# Patient Record
Sex: Male | Born: 1945 | Race: White | Hispanic: No | Smoking: Former smoker
Health system: Southern US, Community
[De-identification: ages and names within clinical notes are randomized; demographics above are authoritative.]

## PROBLEM LIST (undated history)

## (undated) DIAGNOSIS — E782 Mixed hyperlipidemia: Secondary | ICD-10-CM

## (undated) DIAGNOSIS — N529 Male erectile dysfunction, unspecified: Secondary | ICD-10-CM

## (undated) DIAGNOSIS — T7840XA Allergy, unspecified, initial encounter: Secondary | ICD-10-CM

## (undated) DIAGNOSIS — J309 Allergic rhinitis, unspecified: Secondary | ICD-10-CM

## (undated) DIAGNOSIS — N401 Enlarged prostate with lower urinary tract symptoms: Secondary | ICD-10-CM

## (undated) DIAGNOSIS — K4090 Unilateral inguinal hernia, without obstruction or gangrene, not specified as recurrent: Secondary | ICD-10-CM

## (undated) DIAGNOSIS — Z973 Presence of spectacles and contact lenses: Secondary | ICD-10-CM

## (undated) DIAGNOSIS — K649 Unspecified hemorrhoids: Secondary | ICD-10-CM

## (undated) DIAGNOSIS — G454 Transient global amnesia: Secondary | ICD-10-CM

## (undated) DIAGNOSIS — E785 Hyperlipidemia, unspecified: Secondary | ICD-10-CM

## (undated) HISTORY — DX: Unspecified hemorrhoids: K64.9

## (undated) HISTORY — DX: Allergy, unspecified, initial encounter: T78.40XA

## (undated) HISTORY — PX: APPENDECTOMY: SHX54

## (undated) HISTORY — PX: OTHER SURGICAL HISTORY: SHX169

## (undated) HISTORY — DX: Hyperlipidemia, unspecified: E78.5

## (undated) HISTORY — PX: TONSILLECTOMY: SUR1361

## (undated) HISTORY — DX: Transient global amnesia: G45.4

---
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
## 1978-12-15 HISTORY — PX: EXCISIONAL HEMORRHOIDECTOMY: SHX1541

## 2013-09-14 HISTORY — PX: COLONOSCOPY: SHX5424

## 2013-09-14 LAB — HM COLONOSCOPY

## 2014-12-15 DIAGNOSIS — C61 Malignant neoplasm of prostate: Secondary | ICD-10-CM

## 2014-12-15 HISTORY — DX: Malignant neoplasm of prostate: C61

## 2015-12-24 HISTORY — PX: COLONOSCOPY: SHX174

## 2015-12-24 LAB — HM COLONOSCOPY

## 2017-04-06 LAB — HM COLONOSCOPY

## 2018-12-15 HISTORY — PX: CATARACT EXTRACTION W/ INTRAOCULAR LENS IMPLANT: SHX1309

## 2019-11-21 ENCOUNTER — Other Ambulatory Visit: Payer: Self-pay

## 2019-11-22 ENCOUNTER — Ambulatory Visit (INDEPENDENT_AMBULATORY_CARE_PROVIDER_SITE_OTHER): Payer: Medicare Other | Admitting: Family Medicine

## 2019-11-22 ENCOUNTER — Encounter: Payer: Self-pay | Admitting: Family Medicine

## 2019-11-22 VITALS — BP 128/74 | HR 72 | Temp 97.3°F | Ht 73.0 in | Wt 195.0 lb

## 2019-11-22 DIAGNOSIS — E782 Mixed hyperlipidemia: Secondary | ICD-10-CM

## 2019-11-22 DIAGNOSIS — J309 Allergic rhinitis, unspecified: Secondary | ICD-10-CM

## 2019-11-22 DIAGNOSIS — Z8546 Personal history of malignant neoplasm of prostate: Secondary | ICD-10-CM | POA: Insufficient documentation

## 2019-11-22 DIAGNOSIS — C61 Malignant neoplasm of prostate: Secondary | ICD-10-CM | POA: Diagnosis not present

## 2019-11-22 DIAGNOSIS — K635 Polyp of colon: Secondary | ICD-10-CM

## 2019-11-22 DIAGNOSIS — Z8249 Family history of ischemic heart disease and other diseases of the circulatory system: Secondary | ICD-10-CM | POA: Diagnosis not present

## 2019-11-22 LAB — COMPREHENSIVE METABOLIC PANEL
ALT: 18 U/L (ref 0–53)
AST: 17 U/L (ref 0–37)
Albumin: 4 g/dL (ref 3.5–5.2)
Alkaline Phosphatase: 53 U/L (ref 39–117)
BUN: 18 mg/dL (ref 6–23)
CO2: 31 mEq/L (ref 19–32)
Calcium: 9.5 mg/dL (ref 8.4–10.5)
Chloride: 106 mEq/L (ref 96–112)
Creatinine, Ser: 1.03 mg/dL (ref 0.40–1.50)
GFR: 70.36 mL/min (ref >60.00)
Glucose, Bld: 82 mg/dL (ref 70–99)
Potassium: 5.2 mEq/L — ABNORMAL HIGH (ref 3.5–5.1)
Sodium: 140 mEq/L (ref 135–145)
Total Bilirubin: 0.5 mg/dL (ref 0.2–1.2)
Total Protein: 6.2 g/dL (ref 6.0–8.3)

## 2019-11-22 LAB — CBC WITH DIFFERENTIAL/PLATELET
Basophils Absolute: 0 10*3/uL (ref 0.0–0.1)
Basophils Relative: 0.7 % (ref 0.0–3.0)
Eosinophils Absolute: 0.1 10*3/uL (ref 0.0–0.7)
Eosinophils Relative: 1.8 % (ref 0.0–5.0)
HCT: 49.3 % (ref 39.0–52.0)
Hemoglobin: 16.3 g/dL (ref 13.0–17.0)
Lymphocytes Relative: 31.2 % (ref 12.0–46.0)
Lymphs Abs: 1.3 10*3/uL (ref 0.7–4.0)
MCHC: 33.1 g/dL (ref 30.0–36.0)
MCV: 98.9 fl (ref 78.0–100.0)
Monocytes Absolute: 0.5 10*3/uL (ref 0.1–1.0)
Monocytes Relative: 11.7 % (ref 3.0–12.0)
Neutro Abs: 2.3 10*3/uL (ref 1.4–7.7)
Neutrophils Relative %: 54.6 % (ref 43.0–77.0)
Platelets: 237 10*3/uL (ref 150.0–400.0)
RBC: 4.98 Mil/uL (ref 4.22–5.81)
RDW: 14 % (ref 11.5–15.5)
WBC: 4.3 10*3/uL (ref 4.0–10.5)

## 2019-11-22 LAB — LIPID PANEL
Cholesterol: 150 mg/dL (ref 0–200)
HDL: 51.8 mg/dL (ref >39.00)
LDL Cholesterol: 85 mg/dL (ref 0–99)
NonHDL: 98.47
Total CHOL/HDL Ratio: 3
Triglycerides: 67 mg/dL (ref 0.0–149.0)
VLDL: 13.4 mg/dL (ref 0.0–40.0)

## 2019-11-22 LAB — PSA: PSA: 5.93 ng/mL — ABNORMAL HIGH (ref 0.10–4.00)

## 2019-11-22 NOTE — Progress Notes (Signed)
Subjective  CC:  Chief Complaint  Patient presents with  . Establish Care  . Hand Pain    Bilateral  . Annual Exam    HPI: Paul Foster is a 72 y.o. male who presents to [facility] [place] today to establish care with me as a new patient.  No records are currently available for my review, data is pt reported.  He has the following concerns or needs:  Prostate cancer: dxd about 4 years ago. No treatment, waiting and watching. Needs urology referral for f/u. Need old records.   HLD on statin and doing well. Has very strong FH of early cad: dad, brothers and multiple uncles.   H/o colon polyps: last colonoscopy he things was about 5 years ago.   Allergies.   Hm: had pneumonia shot and shingrix at [facility] last year: need records. Had flu shot this year. Fasting for lab work today. Eye exam up to date. Pt declines AWV.   Assessment  1. Mixed hyperlipidemia   2. Family history of premature CAD   3. Polyp of colon, unspecified part of colon, unspecified type   4. Chronic allergic rhinitis   5. Prostate cancer ([place])      Plan   HLD on statin: check lipids and lfts  rec daily baby asa   Need to get GI records to see if needs another colonoscopy. ROR signed  Prostate ca: check psa and refer to urology. Request records.   Clarify imm history and then update accordingly   Follow up:  Return in about 1 year (around 11/21/2020) for complete physical. Orders Placed This Encounter  Procedures  . CBC w/Diff  . CMP  . Lipids  . PSA  . Ambulatory referral to Urology   No orders of the defined types were placed in this encounter.    Depression screen PHQ 2/9 11/22/2019  Decreased Interest 0  Down, Depressed, Hopeless 0  PHQ - 2 Score 0    We updated and reviewed the patient's past history in detail and it is documented below.  Patient Active Problem List   Diagnosis Date Noted  . Mixed hyperlipidemia 11/22/2019  . Family history of premature CAD  11/22/2019  . Polyp of colon 11/22/2019  . Chronic allergic rhinitis 11/22/2019  . Prostate cancer ([place]) 11/22/2019   Health Maintenance  Topic Date Due  . COLONOSCOPY  09/08/1994  . PNA vac Low Risk Adult (2 of 2 - PPSV23) 12/29/2018  . INFLUENZA VACCINE  Completed  . TETANUS/TDAP  Discontinued  . Hepatitis C Screening  Discontinued   Immunization History  Administered Date(s) Administered  . Influenza-Unspecified 08/30/2019   Current Meds  Medication Sig  . Ascorbic Acid (VITAMIN C) 1000 MG tablet Take 1,000 mg by mouth daily.  . ASPIRIN 81 PO Take by mouth.  . Calcium Carbonate (CALCIUM 600 PO) Take by mouth.  . Cholecalciferol (VITAMIN D3 MAXIMUM STRENGTH) 125 MCG (5000 UT) capsule Take 5,000 Units by mouth daily.  Marland Kitchen docusate sodium (COLACE) 100 MG capsule Take 200 mg by mouth daily.  . fluticasone (FLONASE) 50 MCG/ACT nasal spray Place into both nostrils daily.  Marland Kitchen loratadine (CLARITIN) 10 MG tablet Take 10 mg by mouth daily.  Marland Kitchen lovastatin (MEVACOR) 10 MG tablet Take 5 mg by mouth at bedtime.  . Multiple Vitamin (MULTIVITAMIN) tablet Take 1 tablet by mouth daily.  . Omega 3-6-9 Fatty Acids (OMEGA-3 & OMEGA-6 FISH OIL PO) Take by mouth.  . Wheat Dextrin (BENEFIBER PO)  Take by mouth.    Allergies: Patient has No Known Allergies. Past Medical History Patient  has no past medical history on file. Past Surgical History Patient  has a past surgical history that includes Tonsillectomy; Appendectomy; and hemorrhoidectomy. Family History: Patient family history is not on file. Social History:  Patient  reports that he has quit smoking. He has never used smokeless tobacco. He reports previous alcohol use. He reports that he does not use drugs.  Review of Systems: Constitutional: negative for fever or malaise Ophthalmic: negative for photophobia, double vision or loss of vision Cardiovascular: negative for chest pain, dyspnea on exertion, or new LE swelling Respiratory:  negative for SOB or persistent cough Gastrointestinal: negative for abdominal pain, change in bowel habits or melena Genitourinary: negative for dysuria or gross hematuria Musculoskeletal: negative for new gait disturbance or muscular weakness, + burning pain in palms after working in yard sometimes, right shoulder clicks w/o pain.  Integumentary: negative for new or persistent rashes Neurological: negative for TIA or stroke symptoms Psychiatric: negative for SI or delusions Allergic/Immunologic: negative for hives  Patient Care Team    Relationship Specialty Notifications Start End  Leamon Arnt, MD PCP - General Family Medicine  11/22/19     Objective  Vitals: BP 128/74 (BP Location: Right Arm, Patient Position: Sitting, Cuff Size: Normal)   Pulse 72   Temp (!) 97.3 F (36.3 C) (Temporal)   Ht 6\' 1"  (1.854 m)   Wt 195 lb (88.5 kg)   SpO2 96%   BMI 25.73 kg/m  General:  Well developed, well nourished, no acute distress , looks great Psych:  Alert and oriented,normal mood and affect HEENT:  Normocephalic, atraumatic, non-icteric sclera, PERRL, oropharynx is without mass or exudate, supple neck without adenopathy, mass or thyromegaly Cardiovascular:  RRR without gallop, rub or murmur, nondisplaced PMI Respiratory:  Good breath sounds bilaterally, CTAB with normal respiratory effort Gastrointestinal: normal bowel sounds, soft, non-tender, no noted masses. No HSM MSK: no deformities, contusions. Joints are without erythema or swelling Skin:  Warm, no rashes or suspicious lesions noted Neurologic:    Mental status is normal. Gross motor and sensory exams are normal. Normal gait   Commons side effects, risks, benefits, and alternatives for medications and treatment plan prescribed today were discussed, and the patient expressed understanding of the given instructions. Patient is instructed to call or message via MyChart if he/she has any questions or concerns regarding our treatment  plan. No barriers to understanding were identified. We discussed Red Flag symptoms and signs in detail. Patient expressed understanding regarding what to do in case of urgent or emergency type symptoms.   Medication list was reconciled, printed and provided to the patient in AVS. Patient instructions and summary information was reviewed with the patient as documented in the AVS. This note was prepared with assistance of Dragon voice recognition software. Occasional wrong-word or sound-a-like substitutions may have occurred due to the inherent limitations of voice recognition software  This visit occurred during the SARS-CoV-2 public health emergency.  Safety protocols were in place, including screening questions prior to the visit, additional usage of staff PPE, and extensive cleaning of exam room while observing appropriate contact time as indicated for disinfecting solutions.

## 2019-11-22 NOTE — Patient Instructions (Addendum)
Please return in 12 months for your annual complete physical; please come fasting. Please sign a release of records for both your prior PCP and GI doctors offices for my review.   Please check with [facility] to get a list of the vaccinations you had last year for me. I will then update what remains due.    It was a pleasure meeting you today! Thank you for choosing [place] to meet your healthcare needs! I truly look forward to working with you. If you have any questions or concerns, please send me a message via Mychart or call the office at [phone].   Preventive Care 44 Years and Older, Male Preventive care refers to lifestyle choices and visits with your health care provider that can promote health and wellness. This includes:  A yearly physical exam. This is also called an annual well check.  Regular dental and eye exams.  Immunizations.  Screening for certain conditions.  Healthy lifestyle choices, such as diet and exercise. What can I expect for my preventive care visit? Physical exam Your health care provider will check:  Height and weight. These may be used to calculate body mass index (BMI), which is a measurement that tells if you are at a healthy weight.  Heart rate and blood pressure.  Your skin for abnormal spots. Counseling Your health care provider may ask you questions about:  Alcohol, tobacco, and drug use.  Emotional well-being.  Home and relationship well-being.  Sexual activity.  Eating habits.  History of falls.  Memory and ability to understand (cognition).  Work and work Statistician. What immunizations do I need?  Influenza (flu) vaccine  This is recommended every year. Tetanus, diphtheria, and pertussis (Tdap) vaccine  You may need a Td booster every 10 years. Varicella (chickenpox) vaccine  You may need this vaccine if you have not already been vaccinated. Zoster (shingles) vaccine  You may need this after age 47. Pneumococcal  conjugate (PCV13) vaccine  One dose is recommended after age 10. Pneumococcal polysaccharide (PPSV23) vaccine  One dose is recommended after age 43. Measles, mumps, and rubella (MMR) vaccine  You may need at least one dose of MMR if you were born in 1957 or later. You may also need a second dose. Meningococcal conjugate (MenACWY) vaccine  You may need this if you have certain conditions. Hepatitis A vaccine  You may need this if you have certain conditions or if you travel or work in places where you may be exposed to hepatitis A. Hepatitis B vaccine  You may need this if you have certain conditions or if you travel or work in places where you may be exposed to hepatitis B. Haemophilus influenzae type b (Hib) vaccine  You may need this if you have certain conditions. You may receive vaccines as individual doses or as more than one vaccine together in one shot (combination vaccines). Talk with your health care provider about the risks and benefits of combination vaccines. What tests do I need? Blood tests  Lipid and cholesterol levels. These may be checked every 5 years, or more frequently depending on your overall health.  Hepatitis C test.  Hepatitis B test. Screening  Lung cancer screening. You may have this screening every year starting at age 55 if you have a 30-pack-year history of smoking and currently smoke or have quit within the past 15 years.  Colorectal cancer screening. All adults should have this screening starting at age 22 and continuing until age 12. Your health care  provider may recommend screening at age 31 if you are at increased risk. You will have tests every 1-10 years, depending on your results and the type of screening test.  Prostate cancer screening. Recommendations will vary depending on your family history and other risks.  Diabetes screening. This is done by checking your blood sugar (glucose) after you have not eaten for a while (fasting). You may  have this done every 1-3 years.  Abdominal aortic aneurysm (AAA) screening. You may need this if you are a current or former smoker.  Sexually transmitted disease (STD) testing. Follow these instructions at home: Eating and drinking  Eat a diet that includes fresh fruits and vegetables, whole grains, lean protein, and low-fat dairy products. Limit your intake of foods with high amounts of sugar, saturated fats, and salt.  Take vitamin and mineral supplements as recommended by your health care provider.  Do not drink alcohol if your health care provider tells you not to drink.  If you drink alcohol: ? Limit how much you have to 0-2 drinks a day. ? Be aware of how much alcohol is in your drink. In the [place] one drink equals one 12 oz bottle of beer (355 mL), one 5 oz glass of wine (148 mL), or one 1 oz glass of hard liquor (44 mL). Lifestyle  Take daily care of your teeth and gums.  Stay active. Exercise for at least 30 minutes on 5 or more days each week.  Do not use any products that contain nicotine or tobacco, such as cigarettes, e-cigarettes, and chewing tobacco. If you need help quitting, ask your health care provider.  If you are sexually active, practice safe sex. Use a condom or other form of protection to prevent STIs (sexually transmitted infections).  Talk with your health care provider about taking a low-dose aspirin or statin. What's next?  Visit your health care provider once a year for a well check visit.  Ask your health care provider how often you should have your eyes and teeth checked.  Stay up to date on all vaccines. This information is not intended to replace advice given to you by your health care provider. Make sure you discuss any questions you have with your health care provider. Document Released: 12/28/2015 Document Revised: 11/25/2018 Document Reviewed: 11/25/2018 Elsevier Patient Education  2020 [employer].

## 2019-11-23 NOTE — Progress Notes (Signed)
Please call patient: I have reviewed his/her lab results. Labs all look good. Potassium level is just above normal. Ensure he is NOT on any potassium supplements. Nothing else needed at this time.  PSA is 5.93; not sure how this compares to his most recent levels. Have requested records and urology referral. Thanks.

## 2019-11-25 ENCOUNTER — Telehealth: Payer: Self-pay

## 2019-11-25 NOTE — Telephone Encounter (Signed)
Noted.  Abstracted into chart.

## 2019-11-25 NOTE — Telephone Encounter (Signed)
Copied from [place] [id number]. Topic: General - Other >> Nov 25, 2019 12:06 PM Lennox Solders wrote: Reason for CRM: pt is calling to let dr andy know he had colonoscopy on 04/06/2017. Pt does not know who did colonoscopy. [facility] in [place] in [place] will fax a list of pt vaccine

## 2020-01-11 ENCOUNTER — Ambulatory Visit: Payer: Medicare Other

## 2020-01-20 ENCOUNTER — Ambulatory Visit: Payer: Medicare Other | Attending: Internal Medicine

## 2020-01-20 DIAGNOSIS — Z23 Encounter for immunization: Secondary | ICD-10-CM | POA: Insufficient documentation

## 2020-01-20 NOTE — Progress Notes (Signed)
   Covid-19 Vaccination Clinic  Name:  Christropher Untiedt    MRN: [id number] DOB: 08-03-1946  01/20/2020  Mr. Demery was observed post Covid-19 immunization for 15 minutes without incidence. He was provided with Vaccine Information Sheet and instruction to access the V-Safe system.   Mr. Wittman was instructed to call 911 with any severe reactions post vaccine: Marland Kitchen Difficulty breathing  . Swelling of your face and throat  . A fast heartbeat  . A bad rash all over your body  . Dizziness and weakness    Immunizations Administered    Name Date Dose VIS Date Route   Pfizer COVID-19 Vaccine 01/20/2020 12:06 PM 0.3 mL 11/25/2019 Intramuscular   Manufacturer: [place]   Lot: [id number]   [place]: [id number]

## 2020-02-14 ENCOUNTER — Ambulatory Visit: Payer: Medicare Other | Attending: Internal Medicine

## 2020-02-14 DIAGNOSIS — Z23 Encounter for immunization: Secondary | ICD-10-CM | POA: Insufficient documentation

## 2020-02-14 NOTE — Progress Notes (Signed)
   Covid-19 Vaccination Clinic  Name:  Breno Remo    MRN: [id number] DOB: Aug 07, 1946  02/14/2020  Mr. Fink was observed post Covid-19 immunization for 15 minutes without incident. He was provided with Vaccine Information Sheet and instruction to access the V-Safe system.   Mr. Klocke was instructed to call 911 with any severe reactions post vaccine: Marland Kitchen Difficulty breathing  . Swelling of face and throat  . A fast heartbeat  . A bad rash all over body  . Dizziness and weakness   Immunizations Administered    Name Date Dose VIS Date Route   Pfizer COVID-19 Vaccine 02/14/2020 11:40 AM 0.3 mL 11/25/2019 Intramuscular   Manufacturer: [place]   Lot: [id number]   [place]: [id number]

## 2020-05-28 ENCOUNTER — Other Ambulatory Visit: Payer: Self-pay

## 2020-05-28 ENCOUNTER — Telehealth: Payer: Self-pay | Admitting: Family Medicine

## 2020-05-28 MED ORDER — LOVASTATIN 10 MG PO TABS: 5.0000 mg | ORAL_TABLET | Freq: Every day | ORAL | 3 refills | Status: DC

## 2020-05-28 NOTE — Telephone Encounter (Signed)
Script sent to [facility]  

## 2020-05-28 NOTE — Telephone Encounter (Signed)
MEDICATION:  lovastatin (MEVACOR) 20 MG tablet  PHARMACY:  [place], [place] Phone:  [phone]  Fax:  [phone]       Comments: Patient is completely out   **Let patient know to contact pharmacy at the end of the day to make sure medication is ready. **  ** Please notify patient to allow 48-72 hours to process**  **Encourage patient to contact the pharmacy for refills or they can request refills through [facility]**

## 2020-07-25 ENCOUNTER — Other Ambulatory Visit: Payer: Self-pay

## 2020-07-25 ENCOUNTER — Encounter: Payer: Self-pay | Admitting: Family Medicine

## 2020-07-25 ENCOUNTER — Ambulatory Visit (INDEPENDENT_AMBULATORY_CARE_PROVIDER_SITE_OTHER): Payer: Medicare Other | Admitting: Family Medicine

## 2020-07-25 VITALS — BP 120/80 | HR 94 | Temp 98.6°F | Wt 201.8 lb

## 2020-07-25 DIAGNOSIS — F4321 Adjustment disorder with depressed mood: Secondary | ICD-10-CM

## 2020-07-25 DIAGNOSIS — C61 Malignant neoplasm of prostate: Secondary | ICD-10-CM | POA: Diagnosis not present

## 2020-07-25 DIAGNOSIS — E782 Mixed hyperlipidemia: Secondary | ICD-10-CM | POA: Diagnosis not present

## 2020-07-25 DIAGNOSIS — Z8249 Family history of ischemic heart disease and other diseases of the circulatory system: Secondary | ICD-10-CM

## 2020-07-25 MED ORDER — LOVASTATIN 10 MG PO TABS: 10.0000 mg | ORAL_TABLET | Freq: Every day | ORAL | 3 refills | Status: DC

## 2020-07-25 NOTE — Progress Notes (Signed)
Subjective  CC:  Chief Complaint  Patient presents with  . mixed hyperlipidemia    has been taking Lovastatin but read the directions wrong. Taking 10 mg instead of 5 mg    HPI: Paul Foster is a 74 y.o. male who presents to the office today to address the problems listed above in the chief complaint.  Paul Foster is here overall doing well.  His wife passed July 19 and he is coping appropriately.  He was with her at the time of her death and said she passed peacefully.  Now he is trying to coordinate his medical care.  Doing so he learned that the dose on his Mevacor bottle was unclear.  By chart review it looks like he should be on 10 mg daily.  He is currently taking 10 mg daily although prior to that he may have been taking 5 mg or 10 mg.  The chart is unclear.  He has had 2 different doses prescribed to him, 20 mg pill and a 10 mg pill.  He feels fine.  No myalgias.  Prostate cancer: He was referred to [facility] urology and reports that the visit there went well.  Everything is stable.  I need to get those records.  Still awaiting records from his prior PCP/gastroenterologist. Assessment  1. Mixed hyperlipidemia   2. Family history of premature CAD   3. Prostate cancer ([place])   4. Grief      Plan   Hyperlipidemia: Suspect he should be on 10 mg of Mevacor.  Recheck nonfasting lipids today and adjust as needed.  Check LFTs.  Reassured  Prostate cancer: We will [facility] records.  Follow up: Has physical scheduled for: 11/27/2020  Orders Placed This Encounter  Procedures  . Lipid panel  . Hepatic function panel   Meds ordered this encounter  Medications  . lovastatin (MEVACOR) 10 MG tablet    Sig: Take 1 tablet (10 mg total) by mouth at bedtime.    Dispense:  90 tablet    Refill:  3      I reviewed the patients updated PMH, FH, and SocHx.    Patient Active Problem List   Diagnosis Date Noted  . Mixed hyperlipidemia 11/22/2019  . Family history of  premature CAD 11/22/2019  . Polyp of colon 11/22/2019  . Chronic allergic rhinitis 11/22/2019  . Prostate cancer ([place]) 11/22/2019   Current Meds  Medication Sig  . Ascorbic Acid (VITAMIN C) 1000 MG tablet Take 1,000 mg by mouth daily.  . ASPIRIN 81 PO Take by mouth.  . Calcium Carbonate (CALCIUM 600 PO) Take by mouth.  . Cholecalciferol (VITAMIN D3 MAXIMUM STRENGTH) 125 MCG (5000 UT) capsule Take 5,000 Units by mouth daily.  Marland Kitchen docusate sodium (COLACE) 100 MG capsule Take 200 mg by mouth daily.  . fluticasone (FLONASE) 50 MCG/ACT nasal spray Place into both nostrils daily.  Marland Kitchen loratadine (CLARITIN) 10 MG tablet Take 10 mg by mouth daily.  Marland Kitchen lovastatin (MEVACOR) 10 MG tablet Take 1 tablet (10 mg total) by mouth at bedtime.  . Multiple Vitamin (MULTIVITAMIN) tablet Take 1 tablet by mouth daily.  . Omega 3-6-9 Fatty Acids (OMEGA-3 & OMEGA-6 FISH OIL PO) Take by mouth.  . Wheat Dextrin (BENEFIBER PO) Take by mouth.  . [DISCONTINUED] lovastatin (MEVACOR) 10 MG tablet Take 0.5 tablets (5 mg total) by mouth at bedtime.    Allergies: Patient has No Known Allergies. Family History: Patient family history is not on file. Social History:  Patient  reports that he has quit smoking. He has never used smokeless tobacco. He reports previous alcohol use. He reports that he does not use drugs.  Review of Systems: Constitutional: Negative for fever malaise or anorexia Cardiovascular: negative for chest pain Respiratory: negative for SOB or persistent cough Gastrointestinal: negative for abdominal pain  Objective  Vitals: BP 120/80   Pulse 94   Temp 98.6 F (37 C) (Temporal)   Wt 201 lb 12.8 oz (91.5 kg)   SpO2 97%   BMI 26.62 kg/m  General: no acute distress , A&Ox3 Cardiovascular:  RRR without murmur or gallop.  Respiratory:  Good breath sounds bilaterally, CTAB with normal respiratory effort      Commons side effects, risks, benefits, and alternatives for medications and treatment  plan prescribed today were discussed, and the patient expressed understanding of the given instructions. Patient is instructed to call or message via MyChart if he/she has any questions or concerns regarding our treatment plan. No barriers to understanding were identified. We discussed Red Flag symptoms and signs in detail. Patient expressed understanding regarding what to do in case of urgent or emergency type symptoms.   Medication list was reconciled, printed and provided to the patient in AVS. Patient instructions and summary information was reviewed with the patient as documented in the AVS. This note was prepared with assistance of Dragon voice recognition software. Occasional wrong-word or sound-a-like substitutions may have occurred due to the inherent limitations of voice recognition software  This visit occurred during the SARS-CoV-2 public health emergency.  Safety protocols were in place, including screening questions prior to the visit, additional usage of staff PPE, and extensive cleaning of exam room while observing appropriate contact time as indicated for disinfecting solutions.

## 2020-07-25 NOTE — Patient Instructions (Signed)
Please follow up as scheduled for your next visit with me: 11/27/2020   It looks like you used have the 20mg  dose pill of mevacor; this is how it got to take 1/2, however the 10mg  dose was reordered.  I'll let you know if 10mg  nightly is what is needed. Thanks! And again, may you grieve and recover well. I'm glad I got to meet your wife.  May she rest in peace.  If you have any questions or concerns, please don't hesitate to send me a message via MyChart or call the office at [phone]. Thank you for visiting with [place] today! It's our pleasure caring for you.

## 2020-07-26 LAB — HEPATIC FUNCTION PANEL
AG Ratio: 1.7 (calc) (ref 1.0–2.5)
ALT: 17 U/L (ref 9–46)
AST: 15 U/L (ref 10–35)
Albumin: 4 g/dL (ref 3.6–5.1)
Alkaline phosphatase (APISO): 56 U/L (ref 35–144)
Bilirubin, Direct: 0.1 mg/dL (ref 0.0–0.2)
Globulin: 2.3 g/dL (calc) (ref 1.9–3.7)
Indirect Bilirubin: 0.4 mg/dL (calc) (ref 0.2–1.2)
Total Bilirubin: 0.5 mg/dL (ref 0.2–1.2)
Total Protein: 6.3 g/dL (ref 6.1–8.1)

## 2020-07-26 LAB — LIPID PANEL
Cholesterol: 161 mg/dL (ref <200)
HDL: 50 mg/dL (ref >=40)
LDL Cholesterol (Calc): 88 mg/dL (calc)
Non-HDL Cholesterol (Calc): 111 mg/dL (calc) (ref <130)
Total CHOL/HDL Ratio: 3.2 (calc) (ref <5.0)
Triglycerides: 136 mg/dL (ref <150)

## 2020-07-26 NOTE — Progress Notes (Signed)
Please call patient: I have reviewed his/her lab results. Labs look fine. Cholesterol is good. Please have him continue mevacor 10mg  nightly.

## 2020-07-31 ENCOUNTER — Telehealth: Payer: Self-pay

## 2020-07-31 NOTE — Telephone Encounter (Signed)
Patient was notified that medication can be purchased over the counter.

## 2020-07-31 NOTE — Telephone Encounter (Signed)
Pt is wanting to know where his prescription for claritin was sent. I don't see that any was sent in anywhere. Please Advise

## 2020-08-01 ENCOUNTER — Telehealth: Payer: Self-pay

## 2020-08-01 MED ORDER — LOVASTATIN 10 MG PO TABS: 10.0000 mg | ORAL_TABLET | Freq: Every day | ORAL | 3 refills | Status: DC

## 2020-08-01 NOTE — Telephone Encounter (Signed)
Pharmacy told PT they do not have this prescription in their system. Can you please resend? lovastatin (MEVACOR) 10 MG tablet

## 2020-08-01 NOTE — Telephone Encounter (Signed)
Medication has been resent to the pharmacy.  

## 2020-09-28 ENCOUNTER — Telehealth: Payer: Self-pay

## 2020-09-28 NOTE — Telephone Encounter (Signed)
..   LAST APPOINTMENT DATE: 08/01/2020   NEXT APPOINTMENT DATE:@12 /14/2021  MEDICATION:lovastatin (MEVACOR) 10 MG tablet    PHARMACY:[facility] [place], [place]

## 2020-09-30 ENCOUNTER — Other Ambulatory Visit: Payer: Self-pay

## 2020-09-30 MED ORDER — LOVASTATIN 10 MG PO TABS: 10.0000 mg | ORAL_TABLET | Freq: Every day | ORAL | 3 refills | Status: DC

## 2020-09-30 NOTE — Telephone Encounter (Signed)
[facility]

## 2020-11-27 ENCOUNTER — Ambulatory Visit: Payer: Medicare Other | Admitting: Family Medicine

## 2020-11-27 ENCOUNTER — Ambulatory Visit: Payer: Medicare Other

## 2020-12-18 ENCOUNTER — Encounter: Payer: Self-pay | Admitting: Family Medicine

## 2020-12-18 ENCOUNTER — Other Ambulatory Visit: Payer: Self-pay

## 2020-12-18 ENCOUNTER — Ambulatory Visit (INDEPENDENT_AMBULATORY_CARE_PROVIDER_SITE_OTHER): Payer: Medicare Other | Admitting: Family Medicine

## 2020-12-18 VITALS — BP 158/94 | HR 60 | Temp 98.5°F | Wt 207.8 lb

## 2020-12-18 DIAGNOSIS — C61 Malignant neoplasm of prostate: Secondary | ICD-10-CM | POA: Diagnosis not present

## 2020-12-18 DIAGNOSIS — K635 Polyp of colon: Secondary | ICD-10-CM

## 2020-12-18 DIAGNOSIS — M5432 Sciatica, left side: Secondary | ICD-10-CM

## 2020-12-18 DIAGNOSIS — Z8249 Family history of ischemic heart disease and other diseases of the circulatory system: Secondary | ICD-10-CM

## 2020-12-18 DIAGNOSIS — E782 Mixed hyperlipidemia: Secondary | ICD-10-CM

## 2020-12-18 DIAGNOSIS — R030 Elevated blood-pressure reading, without diagnosis of hypertension: Secondary | ICD-10-CM

## 2020-12-18 DIAGNOSIS — J309 Allergic rhinitis, unspecified: Secondary | ICD-10-CM

## 2020-12-18 NOTE — Progress Notes (Signed)
Subjective  Chief Complaint  Patient presents with  . Annual Exam  . Hyperlipidemia    HPI: Paul Foster is a 75 y.o. male who presents to [facility] [place] today for a Male Wellness Visit. He also has the concerns and/or needs as listed above in the chief complaint. These will be addressed in addition to the Health Maintenance Visit.   Wellness Visit: annual visit with health maintenance review and exam    HM: up to date screens and imms. Lives healthy lifestyle. Doing well overall.  Lifestyle:  Wt Readings from Last 3 Encounters:  07/25/20 201 lb 12.8 oz (91.5 kg)  11/22/19 195 lb (88.5 kg)    Chronic disease management visit and/or acute problem visit:  HLD on statin due for recheck. Has been at goal and is well tolerated. Fasting today  Prostate ca w/ routine f/u with urology. Stable.   C/o left hip/buttock pain radiates down left ant thigh and some stiffness in the mornings. Once he moves around, it resolves. No weakness. No low back pain. No injury.   Patient Active Problem List   Diagnosis Date Noted  . Mixed hyperlipidemia 11/22/2019  . Family history of premature CAD 11/22/2019  . Polyp of colon 11/22/2019  . Chronic allergic rhinitis 11/22/2019  . Prostate cancer ([place]) 11/22/2019   Health Maintenance  Topic Date Due  . COVID-19 Vaccine (4 - Booster for Pfizer series) 03/19/2021  . COLONOSCOPY (Pts 45-62yrs Insurance coverage will need to be confirmed)  04/06/2022  . TETANUS/TDAP  01/06/2029  . INFLUENZA VACCINE  Completed  . PNA vac Low Risk Adult  Completed  . Hepatitis C Screening  Discontinued   Immunization History  Administered Date(s) Administered  . Hepatitis B 03/09/1992  . Influenza-Unspecified 10/07/2018, 08/30/2019, 09/17/2020  . PFIZER SARS-COV-2 Vaccination 01/20/2020, 02/14/2020, 09/18/2020  . Pneumococcal Conjugate-13 10/28/2016  . Pneumococcal Polysaccharide-23 10/07/2018  . Tdap 01/06/2019  . Zoster Recombinat  (Shingrix) 10/07/2018, 01/06/2019   We updated and reviewed the patient's past history in detail and it is documented below. Allergies: Patient has No Known Allergies. Past Medical History  has a past medical history of Allergy and Hyperlipidemia. Past Surgical History Patient  has a past surgical history that includes Tonsillectomy; Appendectomy; and hemorrhoidectomy. Social History Patient  reports that he has quit smoking. He has never used smokeless tobacco. He reports previous alcohol use. He reports that he does not use drugs. Family History family history includes Heart disease in his father. Review of Systems: Constitutional: negative for fever or malaise Ophthalmic: negative for photophobia, double vision or loss of vision Cardiovascular: negative for chest pain, dyspnea on exertion, or new LE swelling Respiratory: negative for SOB or persistent cough Gastrointestinal: negative for abdominal pain, change in bowel habits or melena Genitourinary: negative for dysuria or gross hematuria Musculoskeletal: negative for new gait disturbance or muscular weakness Integumentary: negative for new or persistent rashes Neurological: negative for TIA or stroke symptoms Psychiatric: negative for SI or delusions Allergic/Immunologic: negative for hives  Patient Care Team    Relationship Specialty Notifications Start End  Leamon Arnt, MD PCP - General Family Medicine  11/22/19    Objective  Vitals: There were no vitals taken for this visit. bp elevated 156/94 - no h/o htn General:  Well developed, well nourished, no acute distress  Psych:  Alert and orientedx3,normal mood and affect HEENT:  Normocephalic, atraumatic, non-icteric sclera, PERRL, oropharynx is clear without mass or exudate, supple neck without adenopathy, mass  or thyromegaly Cardiovascular:  Normal S1, S2, RRR without gallop, rub or murmur, nondisplaced PMI, +2 distal pulses in bilateral upper and lower  extremities. Respiratory:  Good breath sounds bilaterally, CTAB with normal respiratory effort Gastrointestinal: normal bowel sounds, soft, non-tender, no noted masses. No HSM MSK: no deformities, contusions. Joints are without erythema or swelling. Spine and CVA region are nontender Skin:  Warm, no rashes or suspicious lesions noted Neurologic:    Mental status is normal. CN 2-11 are normal. Gross motor and sensory exams are normal. Stable gait. No tremor GU: No inguinal hernias or adenopathy are appreciated bilaterally   Assessment  1. Mixed hyperlipidemia   2. Family history of premature CAD   3. Polyp of colon, unspecified part of colon, unspecified type   4. Prostate cancer (HCC)   5. Chronic allergic rhinitis   6. Left sided sciatica   7. Elevated blood pressure reading without diagnosis of hypertension      Plan  Male Wellness Visit:  Age appropriate Health Maintenance and Prevention measures were discussed with patient. Included topics are cancer screening recommendations, ways to keep healthy (see AVS) including dietary and exercise recommendations, regular eye and dental care, use of seat belts, and avoidance of moderate alcohol use and tobacco use.   BMI: discussed patient's BMI and encouraged positive lifestyle modifications to help get to or maintain a target BMI.  HM needs and immunizations were addressed and ordered. See below for orders. See HM and immunization section for updates.  Routine labs and screening tests ordered including cmp, cbc and lipids where appropriate.  Discussed recommendations regarding Vit D and calcium supplementation (see AVS)  Chronic disease f/u and/or acute problem visit: (deemed necessary to be done in addition to the wellness visit):  HLD: recheck today on mevacor. Check lfts  Elevated bp: pt reports due to rushing to get here. He will check readings at home and send them to me.   Mild sciatica +/- OA; alleve as needed. Stretching/ f/u  if worsens  Prostate ca per urology.   alergies are controlled.   Follow up: Return in about 1 year (around 12/18/2021) for complete physical.   Commons side effects, risks, benefits, and alternatives for medications and treatment plan prescribed today were discussed, and the patient expressed understanding of the given instructions. Patient is instructed to call or message via MyChart if he/she has any questions or concerns regarding our treatment plan. No barriers to understanding were identified. We discussed Red Flag symptoms and signs in detail. Patient expressed understanding regarding what to do in case of urgent or emergency type symptoms.   Medication list was reconciled, printed and provided to the patient in AVS. Patient instructions and summary information was reviewed with the patient as documented in the AVS. This note was prepared with assistance of Dragon voice recognition software. Occasional wrong-word or sound-a-like substitutions may have occurred due to the inherent limitations of voice recognition software  This visit occurred during the SARS-CoV-2 public health emergency.  Safety protocols were in place, including screening questions prior to the visit, additional usage of staff PPE, and extensive cleaning of exam room while observing appropriate contact time as indicated for disinfecting solutions.   Orders Placed This Encounter  Procedures  . CBC with Differential/Platelet  . Comprehensive metabolic panel  . Lipid panel  . TSH   No orders of the defined types were placed in this encounter.

## 2020-12-18 NOTE — Patient Instructions (Signed)
Please return in 12 months for your annual complete physical; please come fasting.  I will release your lab results to you on your MyChart account with further instructions. Please reply with any questions.    If you have any questions or concerns, please don't hesitate to send me a message via MyChart or call the office at [phone]. Thank you for visiting with us today! It's our pleasure caring for you.   Preventive Care 65 Years and Older, Male Preventive care refers to lifestyle choices and visits with your health care provider that can promote health and wellness. This includes:  A yearly physical exam. This is also called an annual well check.  Regular dental and eye exams.  Immunizations.  Screening for certain conditions.  Healthy lifestyle choices, such as diet and exercise. What can I expect for my preventive care visit? Physical exam Your health care provider will check:  Height and weight. These may be used to calculate body mass index (BMI), which is a measurement that tells if you are at a healthy weight.  Heart rate and blood pressure.  Your skin for abnormal spots. Counseling Your health care provider may ask you questions about:  Alcohol, tobacco, and drug use.  Emotional well-being.  Home and relationship well-being.  Sexual activity.  Eating habits.  History of falls.  Memory and ability to understand (cognition).  Work and work environment. What immunizations do I need?  Influenza (flu) vaccine  This is recommended every year. Tetanus, diphtheria, and pertussis (Tdap) vaccine  You may need a Td booster every 10 years. Varicella (chickenpox) vaccine  You may need this vaccine if you have not already been vaccinated. Zoster (shingles) vaccine  You may need this after age 60. Pneumococcal conjugate (PCV13) vaccine  One dose is recommended after age 65. Pneumococcal polysaccharide (PPSV23) vaccine  One dose is recommended after age  65. Measles, mumps, and rubella (MMR) vaccine  You may need at least one dose of MMR if you were born in 1957 or later. You may also need a second dose. Meningococcal conjugate (MenACWY) vaccine  You may need this if you have certain conditions. Hepatitis A vaccine  You may need this if you have certain conditions or if you travel or work in places where you may be exposed to hepatitis A. Hepatitis B vaccine  You may need this if you have certain conditions or if you travel or work in places where you may be exposed to hepatitis B. Haemophilus influenzae type b (Hib) vaccine  You may need this if you have certain conditions. You may receive vaccines as individual doses or as more than one vaccine together in one shot (combination vaccines). Talk with your health care provider about the risks and benefits of combination vaccines. What tests do I need? Blood tests  Lipid and cholesterol levels. These may be checked every 5 years, or more frequently depending on your overall health.  Hepatitis C test.  Hepatitis B test. Screening  Lung cancer screening. You may have this screening every year starting at age 55 if you have a 30-pack-year history of smoking and currently smoke or have quit within the past 15 years.  Colorectal cancer screening. All adults should have this screening starting at age 50 and continuing until age 75. Your health care provider may recommend screening at age 45 if you are at increased risk. You will have tests every 1-10 years, depending on your results and the type of screening test.  Prostate cancer   screening. Recommendations will vary depending on your family history and other risks.  Diabetes screening. This is done by checking your blood sugar (glucose) after you have not eaten for a while (fasting). You may have this done every 1-3 years.  Abdominal aortic aneurysm (AAA) screening. You may need this if you are a current or former smoker.  Sexually  transmitted disease (STD) testing. Follow these instructions at home: Eating and drinking  Eat a diet that includes fresh fruits and vegetables, whole grains, lean protein, and low-fat dairy products. Limit your intake of foods with high amounts of sugar, saturated fats, and salt.  Take vitamin and mineral supplements as recommended by your health care provider.  Do not drink alcohol if your health care provider tells you not to drink.  If you drink alcohol: ? Limit how much you have to 0-2 drinks a day. ? Be aware of how much alcohol is in your drink. In the [place] one drink equals one 12 oz bottle of beer (355 mL), one 5 oz glass of wine (148 mL), or one 1 oz glass of hard liquor (44 mL). Lifestyle  Take daily care of your teeth and gums.  Stay active. Exercise for at least 30 minutes on 5 or more days each week.  Do not use any products that contain nicotine or tobacco, such as cigarettes, e-cigarettes, and chewing tobacco. If you need help quitting, ask your health care provider.  If you are sexually active, practice safe sex. Use a condom or other form of protection to prevent STIs (sexually transmitted infections).  Talk with your health care provider about taking a low-dose aspirin or statin. What's next?  Visit your health care provider once a year for a well check visit.  Ask your health care provider how often you should have your eyes and teeth checked.  Stay up to date on all vaccines. This information is not intended to replace advice given to you by your health care provider. Make sure you discuss any questions you have with your health care provider. Document Revised: 11/25/2018 Document Reviewed: 11/25/2018 Elsevier Patient Education  2020 [employer] Inc[employer]   

## 2021-06-26 ENCOUNTER — Telehealth: Payer: Self-pay | Admitting: Family Medicine

## 2021-06-26 NOTE — Telephone Encounter (Signed)
Copied from [place] [phone]. Topic: Medicare AWV >> Jun 26, 2021 10:33 AM Harris-Coley, Hannah Beat wrote: Reason for CRM: Left message for patient to schedule Annual Wellness Visit.  Please schedule with Nurse Health Advisor Charlott Rakes, RN at [facility].

## 2021-07-15 ENCOUNTER — Ambulatory Visit (INDEPENDENT_AMBULATORY_CARE_PROVIDER_SITE_OTHER): Payer: Medicare Other

## 2021-07-15 DIAGNOSIS — Z0000 Encounter for general adult medical examination without abnormal findings: Secondary | ICD-10-CM | POA: Diagnosis not present

## 2021-07-15 NOTE — Patient Instructions (Signed)
[name] , Thank you for taking time to come for your Medicare Wellness Visit. I appreciate your ongoing commitment to your health goals. Please review the following plan we discussed and let me know if I can assist you in the future.   Screening recommendations/referrals: Colonoscopy: Done 04/06/17 Recommended yearly ophthalmology/optometry visit for glaucoma screening and checkup Recommended yearly dental visit for hygiene and checkup  Vaccinations: Influenza vaccine: Due after 07/15/21 Pneumococcal vaccine: Completed  Tdap vaccine: Done 01/06/19 repeat every 10 years  Shingles vaccine: Completed 10/07/18 & 01/06/19   Covid-19: Completed 2/5, 3/2, & 09/18/20  Advanced directives: Please bring a copy of your health care power of attorney and living will to the office at your convenience.  Conditions/risks identified: maintain blood pressure   Next appointment: Follow up in one year for your annual wellness visit.   Preventive Care 55 Years and Older, Male Preventive care refers to lifestyle choices and visits with your health care provider that can promote health and wellness. What does preventive care include? A yearly physical exam. This is also called an annual well check. Dental exams once or twice a year. Routine eye exams. Ask your health care provider how often you should have your eyes checked. Personal lifestyle choices, including: Daily care of your teeth and gums. Regular physical activity. Eating a healthy diet. Avoiding tobacco and drug use. Limiting alcohol use. Practicing safe sex. Taking low doses of aspirin every day. Taking vitamin and mineral supplements as recommended by your health care provider. What happens during an annual well check? The services and screenings done by your health care provider during your annual well check will depend on your age, overall health, lifestyle risk factors, and family history of disease. Counseling  Your health care provider  may ask you questions about your: Alcohol use. Tobacco use. Drug use. Emotional well-being. Home and relationship well-being. Sexual activity. Eating habits. History of falls. Memory and ability to understand (cognition). Work and work Statistician. Screening  You may have the following tests or measurements: Height, weight, and BMI. Blood pressure. Lipid and cholesterol levels. These may be checked every 5 years, or more frequently if you are over 23 years old. Skin check. Lung cancer screening. You may have this screening every year starting at age 65 if you have a 30-pack-year history of smoking and currently smoke or have quit within the past 15 years. Fecal occult blood test (FOBT) of the stool. You may have this test every year starting at age 40. Flexible sigmoidoscopy or colonoscopy. You may have a sigmoidoscopy every 5 years or a colonoscopy every 10 years starting at age 59. Prostate cancer screening. Recommendations will vary depending on your family history and other risks. Hepatitis C blood test. Hepatitis B blood test. Sexually transmitted disease (STD) testing. Diabetes screening. This is done by checking your blood sugar (glucose) after you have not eaten for a while (fasting). You may have this done every 1-3 years. Abdominal aortic aneurysm (AAA) screening. You may need this if you are a current or former smoker. Osteoporosis. You may be screened starting at age 51 if you are at high risk. Talk with your health care provider about your test results, treatment options, and if necessary, the need for more tests. Vaccines  Your health care provider may recommend certain vaccines, such as: Influenza vaccine. This is recommended every year. Tetanus, diphtheria, and acellular pertussis (Tdap, Td) vaccine. You may need a Td booster every 10 years. Zoster vaccine. You may need  this after age 63. Pneumococcal 13-valent conjugate (PCV13) vaccine. One dose is recommended after  age 50. Pneumococcal polysaccharide (PPSV23) vaccine. One dose is recommended after age 55. Talk to your health care provider about which screenings and vaccines you need and how often you need them. This information is not intended to replace advice given to you by your health care provider. Make sure you discuss any questions you have with your health care provider. Document Released: 12/28/2015 Document Revised: 08/20/2016 Document Reviewed: 10/02/2015 Elsevier Interactive Patient Education  2017 [place] Prevention in the Home Falls can cause injuries. They can happen to people of all ages. There are many things you can do to make your home safe and to help prevent falls. What can I do on the outside of my home? Regularly fix the edges of walkways and driveways and fix any cracks. Remove anything that might make you trip as you walk through a door, such as a raised step or threshold. Trim any bushes or trees on the path to your home. Use bright outdoor lighting. Clear any walking paths of anything that might make someone trip, such as rocks or tools. Regularly check to see if handrails are loose or broken. Make sure that both sides of any steps have handrails. Any raised decks and porches should have guardrails on the edges. Have any leaves, snow, or ice cleared regularly. Use sand or salt on walking paths during winter. Clean up any spills in your garage right away. This includes oil or grease spills. What can I do in the bathroom? Use night lights. Install grab bars by the toilet and in the tub and shower. Do not use towel bars as grab bars. Use non-skid mats or decals in the tub or shower. If you need to sit down in the shower, use a plastic, non-slip stool. Keep the floor dry. Clean up any water that spills on the floor as soon as it happens. Remove soap buildup in the tub or shower regularly. Attach bath mats securely with double-sided non-slip rug tape. Do not have  throw rugs and other things on the floor that can make you trip. What can I do in the bedroom? Use night lights. Make sure that you have a light by your bed that is easy to reach. Do not use any sheets or blankets that are too big for your bed. They should not hang down onto the floor. Have a firm chair that has side arms. You can use this for support while you get dressed. Do not have throw rugs and other things on the floor that can make you trip. What can I do in the kitchen? Clean up any spills right away. Avoid walking on wet floors. Keep items that you use a lot in easy-to-reach places. If you need to reach something above you, use a strong step stool that has a grab bar. Keep electrical cords out of the way. Do not use floor polish or wax that makes floors slippery. If you must use wax, use non-skid floor wax. Do not have throw rugs and other things on the floor that can make you trip. What can I do with my stairs? Do not leave any items on the stairs. Make sure that there are handrails on both sides of the stairs and use them. Fix handrails that are broken or loose. Make sure that handrails are as long as the stairways. Check any carpeting to make sure that it is firmly attached to  the stairs. Fix any carpet that is loose or worn. Avoid having throw rugs at the top or bottom of the stairs. If you do have throw rugs, attach them to the floor with carpet tape. Make sure that you have a light switch at the top of the stairs and the bottom of the stairs. If you do not have them, ask someone to add them for you. What else can I do to help prevent falls? Wear shoes that: Do not have high heels. Have rubber bottoms. Are comfortable and fit you well. Are closed at the toe. Do not wear sandals. If you use a stepladder: Make sure that it is fully opened. Do not climb a closed stepladder. Make sure that both sides of the stepladder are locked into place. Ask someone to hold it for you, if  possible. Clearly mark and make sure that you can see: Any grab bars or handrails. First and last steps. Where the edge of each step is. Use tools that help you move around (mobility aids) if they are needed. These include: Canes. Walkers. Scooters. Crutches. Turn on the lights when you go into a dark area. Replace any light bulbs as soon as they burn out. Set up your furniture so you have a clear path. Avoid moving your furniture around. If any of your floors are uneven, fix them. If there are any pets around you, be aware of where they are. Review your medicines with your doctor. Some medicines can make you feel dizzy. This can increase your chance of falling. Ask your doctor what other things that you can do to help prevent falls. This information is not intended to replace advice given to you by your health care provider. Make sure you discuss any questions you have with your health care provider. Document Released: 09/27/2009 Document Revised: 05/08/2016 Document Reviewed: 01/05/2015 Elsevier Interactive Patient Education  2017 [employer].

## 2021-07-15 NOTE — Progress Notes (Addendum)
Virtual Visit via Telephone Note  I connected with  Paul Foster on 07/15/21 at 11:00 AM EDT by telephone and verified that I am speaking with the correct person using two identifiers.  Medicare Annual Wellness visit completed telephonically due to Covid-19 pandemic.   Persons participating in this call: This Health Coach and this patient.   Location: Patient: Home Provider: Office    I discussed the limitations, risks, security and privacy concerns of performing an evaluation and management service by telephone and the availability of in person appointments. The patient expressed understanding and agreed to proceed.  Unable to perform video visit due to video visit attempted and failed and/or patient does not have video capability.   Some vital signs may be absent or patient reported.   Willette Brace, LPN   Subjective:   Paul Foster is a 75 y.o. male who presents for an Initial Medicare Annual Wellness Visit.  Review of Systems     Cardiac Risk Factors include: advanced age (>12mn, >>73women);dyslipidemia;male gender     Objective:    There were no vitals filed for this visit. There is no height or weight on file to calculate BMI.  Advanced Directives 07/15/2021  Does Patient Have a Medical Advance Directive? Yes  Type of Advance Directive [place] Chart? No - copy requested    Current Medications (verified) Outpatient Encounter Medications as of 07/15/2021  Medication Sig   Ascorbic Acid (VITAMIN C) 1000 MG tablet Take 1,000 mg by mouth daily.   ASPIRIN 81 PO Take by mouth.   Calcium Carbonate (CALCIUM 600 PO) Take by mouth.   Cholecalciferol (VITAMIN D3 MAXIMUM STRENGTH) 125 MCG (5000 UT) capsule Take 5,000 Units by mouth daily.   docusate sodium (COLACE) 100 MG capsule Take 200 mg by mouth daily.   Flavoring Agent (BLUEBERRY FLAVOR) LIQD by Does not apply route. Bluberry extract 600 mg   loratadine  (CLARITIN) 10 MG tablet Take 10 mg by mouth daily.   lovastatin (MEVACOR) 10 MG tablet Take 1 tablet (10 mg total) by mouth at bedtime.   Multiple Vitamin (MULTIVITAMIN) tablet Take 1 tablet by mouth daily.   Omega 3-6-9 Fatty Acids (OMEGA-3 & OMEGA-6 FISH OIL PO) Take by mouth.   Wheat Dextrin (BENEFIBER PO) Take by mouth.   fluticasone (FLONASE) 50 MCG/ACT nasal spray Place into both nostrils daily. (Patient not taking: Reported on 07/15/2021)   No facility-administered encounter medications on file as of 07/15/2021.    Allergies (verified) Patient has no known allergies.   History: Past Medical History:  Diagnosis Date   Allergy    Hyperlipidemia    Past Surgical History:  Procedure Laterality Date   APPENDECTOMY     hemorrhoidectomy     TONSILLECTOMY     Family History  Problem Relation Age of Onset   Heart disease Father    Social History   Socioeconomic History   Marital status: Widowed    Spouse name: [place]  Number of children: Not on file   Years of education: Not on file   Highest education level: Not on file  Occupational History   Not on file  Tobacco Use   Smoking status: Former   Smokeless tobacco: Never  Substance and Sexual Activity   Alcohol use: Not Currently   Drug use: Never   Sexual activity: Not Currently  Other Topics Concern   Not on file  Social History Narrative   Wife,  Romero Belling, passed July 02, 2020   Social Determinants of Health   Financial Resource Strain: Low Risk    Difficulty of Paying Living Expenses: Not hard at all  Food Insecurity: No Food Insecurity   Worried About Charity fundraiser in the Last Year: Never true   Arboriculturist in the Last Year: Never true  Transportation Needs: No Transportation Needs   Lack of Transportation (Medical): No   Lack of Transportation (Non-Medical): No  Physical Activity: Sufficiently Active   Days of Exercise per Week: 7 days   Minutes of Exercise per Session: 30 min  Stress:  No Stress Concern Present   Feeling of Stress : Not at all  Social Connections: Moderately Isolated   Frequency of Communication with Friends and Family: More than three times a week   Frequency of Social Gatherings with Friends and Family: More than three times a week   Attends Religious Services: More than 4 times per year   Active Member of [employer] or Organizations: No   Attends Archivist Meetings: Never   Marital Status: Widowed    Tobacco Counseling Counseling given: Not Answered   Clinical Intake:  Pre-visit preparation completed: Yes  Pain : No/denies pain     BMI - recorded: 27.42 Nutritional Status: BMI 25 -29 Overweight Nutritional Risks: None Diabetes: No  How often do you need to have someone help you when you read instructions, pamphlets, or other written materials from your doctor or pharmacy?: 1 - Never  Diabetic?No  Interpreter Needed?: No  Information entered by :: Charlott Rakes, LPN   Activities of Daily Living In your present state of health, do you have any difficulty performing the following activities: 07/15/2021  Hearing? N  Vision? N  Difficulty concentrating or making decisions? N  Walking or climbing stairs? N  Dressing or bathing? N  Doing errands, shopping? N  Preparing Food and eating ? N  Using the Toilet? N  In the past six months, have you accidently leaked urine? N  Do you have problems with loss of bowel control? N  Managing your Medications? N  Managing your Finances? N  Housekeeping or managing your Housekeeping? N  Some recent data might be hidden    Patient Care Team: Leamon Arnt, MD as PCP - General (Family Medicine)  Indicate any recent Medical Services you may have received from other than [facility] providers in the past year (date may be approximate).     Assessment:   This is a routine wellness examination for [facility].  Hearing/Vision screen Hearing Screening - Comments:: Pt denies any hearing issues   Vision Screening - Comments:: Pt follows up with provider [address] for annual eye exams   Dietary issues and exercise activities discussed: Current Exercise Habits: Structured exercise class, Type of exercise: stretching, Time (Minutes): 30, Frequency (Times/Week): 7, Weekly Exercise (Minutes/Week): 210   Goals Addressed             This Visit's Progress    Patient Stated       Maintain Blood pressure        Depression Screen PHQ 2/9 Scores 07/15/2021 11/22/2019  PHQ - 2 Score 0 0    Fall Risk Fall Risk  07/15/2021  Falls in the past year? 0  Number falls in past yr: 0  Injury with Fall? 0  Risk for fall due to : Impaired vision  Follow up Falls prevention discussed    FALL RISK PREVENTION PERTAINING  TO THE HOME:  Any stairs in or around the home? Yes  If so, are there any without handrails? No  Home free of loose throw rugs in walkways, pet beds, electrical cords, etc? Yes  Adequate lighting in your home to reduce risk of falls? Yes   ASSISTIVE DEVICES UTILIZED TO PREVENT FALLS:  Life alert? No  Use of a cane, walker or w/c? No  Grab bars in the bathroom? Yes  Shower chair or bench in shower? Yes  Elevated toilet seat or a handicapped toilet? Yes   TIMED UP AND GO:  Was the test performed? No     Cognitive Function:     6CIT Screen 07/15/2021  What Year? 0 points  What month? 0 points  What time? 0 points  Count back from 20 0 points  Months in reverse 0 points  Repeat phrase 2 points  Total Score 2    Immunizations Immunization History  Administered Date(s) Administered   Hepatitis B [birth year]1992   Influenza-Unspecified 10/07/2018, 08/30/2019, 09/17/2020   PFIZER(Purple Top)SARS-COV-2 Vaccination 01/20/2020, 02/14/2020, 09/18/2020   Pneumococcal Conjugate-13 10/28/2016   Pneumococcal Polysaccharide-23 10/07/2018   Tdap 01/06/2019   Zoster Recombinat (Shingrix) 10/07/2018, 01/06/2019    TDAP status: Up to date  Flu Vaccine status: Due, Education  has been provided regarding the importance of this vaccine. Advised may receive this vaccine at local pharmacy or Health Dept. Aware to provide a copy of the vaccination record if obtained from local pharmacy or Health Dept. Verbalized acceptance and understanding.  Pneumococcal vaccine status: Up to date  Covid-19 vaccine status: Completed vaccines  Qualifies for Shingles Vaccine? Yes   Zostavax completed Yes   Shingrix Completed?: Yes  Screening Tests Health Maintenance  Topic Date Due   COVID-19 Vaccine (4 - Booster for Pfizer series) 12/19/2020   INFLUENZA VACCINE  07/15/2021   COLONOSCOPY (Pts 45-70yr Insurance coverage will need to be confirmed)  04/06/2022   TETANUS/TDAP  01/06/2029   PNA vac Low Risk Adult  Completed   Zoster Vaccines- Shingrix  Completed   HPV VACCINES  Aged Out   Hepatitis C Screening  Discontinued    Health Maintenance  Health Maintenance Due  Topic Date Due   COVID-19 Vaccine (4 - Booster for Pfizer series) 12/19/2020   INFLUENZA VACCINE  07/15/2021    Colorectal cancer screening: Type of screening: Colonoscopy. Completed 04/06/17. Repeat every 5 years    Additional Screening:  Hepatitis C Screening: does not qualify;   Vision Screening: Recommended annual ophthalmology exams for early detection of glaucoma and other disorders of the eye. Is the patient up to date with their annual eye exam?  Yes  Who is the provider or what is the name of the office in which the patient attends annual eye exams? Provider [address]  If pt is not established with a provider, would they like to be referred to a provider to establish care? No .   Dental Screening: Recommended annual dental exams for proper oral hygiene  Community Resource Referral / Chronic Care Management: CRR required this visit?  No   CCM required this visit?  No      Plan:     I have personally reviewed and noted the following in the patient's chart:   Medical and social  history Use of alcohol, tobacco or illicit drugs  Current medications and supplements including opioid prescriptions. Patient is not currently taking opioid prescriptions. Functional ability and status Nutritional status Physical activity Advanced directives List of  other physicians Hospitalizations, surgeries, and ER visits in previous 12 months Vitals Screenings to include cognitive, depression, and falls Referrals and appointments  In addition, I have reviewed and discussed with patient certain preventive protocols, quality metrics, and best practice recommendations. A written personalized care plan for preventive services as well as general preventive health recommendations were provided to patient.     Willette Brace, LPN   [id number]   Nurse Notes: None

## 2021-07-21 ENCOUNTER — Emergency Department (HOSPITAL_COMMUNITY): Payer: Medicare Other

## 2021-07-21 ENCOUNTER — Observation Stay (HOSPITAL_COMMUNITY)
Admission: EM | Admit: 2021-07-21 | Discharge: 2021-07-22 | Disposition: A | Discharge status: Home / Self Care | Payer: Medicare Other | Attending: Internal Medicine | Admitting: Internal Medicine

## 2021-07-21 ENCOUNTER — Other Ambulatory Visit: Payer: Self-pay

## 2021-07-21 ENCOUNTER — Encounter (HOSPITAL_COMMUNITY): Payer: Self-pay

## 2021-07-21 ENCOUNTER — Observation Stay (HOSPITAL_COMMUNITY): Payer: Medicare Other

## 2021-07-21 DIAGNOSIS — Z7982 Long term (current) use of aspirin: Secondary | ICD-10-CM | POA: Insufficient documentation

## 2021-07-21 DIAGNOSIS — Z20822 Contact with and (suspected) exposure to covid-19: Secondary | ICD-10-CM | POA: Diagnosis not present

## 2021-07-21 DIAGNOSIS — Y900 Blood alcohol level of less than 20 mg/100 ml: Secondary | ICD-10-CM | POA: Insufficient documentation

## 2021-07-21 DIAGNOSIS — Z87891 Personal history of nicotine dependence: Secondary | ICD-10-CM | POA: Diagnosis not present

## 2021-07-21 DIAGNOSIS — R410 Disorientation, unspecified: Secondary | ICD-10-CM | POA: Diagnosis present

## 2021-07-21 DIAGNOSIS — G454 Transient global amnesia: Secondary | ICD-10-CM

## 2021-07-21 DIAGNOSIS — E782 Mixed hyperlipidemia: Secondary | ICD-10-CM | POA: Diagnosis present

## 2021-07-21 DIAGNOSIS — Z79899 Other long term (current) drug therapy: Secondary | ICD-10-CM | POA: Insufficient documentation

## 2021-07-21 DIAGNOSIS — R079 Chest pain, unspecified: Secondary | ICD-10-CM | POA: Diagnosis present

## 2021-07-21 DIAGNOSIS — R55 Syncope and collapse: Secondary | ICD-10-CM

## 2021-07-21 DIAGNOSIS — R0789 Other chest pain: Secondary | ICD-10-CM | POA: Insufficient documentation

## 2021-07-21 DIAGNOSIS — Z8546 Personal history of malignant neoplasm of prostate: Secondary | ICD-10-CM | POA: Insufficient documentation

## 2021-07-21 HISTORY — DX: Transient global amnesia: G45.4

## 2021-07-21 LAB — PROTIME-INR
INR: 1 (ref 0.8–1.2)
Prothrombin Time: 12.9 seconds (ref 11.4–15.2)

## 2021-07-21 LAB — URINALYSIS, ROUTINE W REFLEX MICROSCOPIC
Bilirubin Urine: NEGATIVE
Glucose, UA: NEGATIVE mg/dL
Hgb urine dipstick: NEGATIVE
Ketones, ur: NEGATIVE mg/dL
Leukocytes,Ua: NEGATIVE
Nitrite: NEGATIVE
Protein, ur: NEGATIVE mg/dL
Specific Gravity, Urine: 1.008 (ref 1.005–1.030)
pH: 6 (ref 5.0–8.0)

## 2021-07-21 LAB — DIFFERENTIAL
Abs Immature Granulocytes: 0.03 10*3/uL (ref 0.00–0.07)
Basophils Absolute: 0 10*3/uL (ref 0.0–0.1)
Basophils Relative: 0 %
Eosinophils Absolute: 0 10*3/uL (ref 0.0–0.5)
Eosinophils Relative: 0 %
Immature Granulocytes: 0 %
Lymphocytes Relative: 10 %
Lymphs Abs: 0.9 10*3/uL (ref 0.7–4.0)
Monocytes Absolute: 0.5 10*3/uL (ref 0.1–1.0)
Monocytes Relative: 6 %
Neutro Abs: 7.5 10*3/uL (ref 1.7–7.7)
Neutrophils Relative %: 84 %

## 2021-07-21 LAB — COMPREHENSIVE METABOLIC PANEL
ALT: 20 U/L (ref 0–44)
AST: 21 U/L (ref 15–41)
Albumin: 3.5 g/dL (ref 3.5–5.0)
Alkaline Phosphatase: 46 U/L (ref 38–126)
Anion gap: 6 (ref 5–15)
BUN: 23 mg/dL (ref 8–23)
CO2: 24 mmol/L (ref 22–32)
Calcium: 9.3 mg/dL (ref 8.9–10.3)
Chloride: 108 mmol/L (ref 98–111)
Creatinine, Ser: 1.27 mg/dL — ABNORMAL HIGH (ref 0.61–1.24)
GFR, Estimated: 59 mL/min — ABNORMAL LOW (ref >60)
Glucose, Bld: 111 mg/dL — ABNORMAL HIGH (ref 70–99)
Potassium: 5 mmol/L (ref 3.5–5.1)
Sodium: 138 mmol/L (ref 135–145)
Total Bilirubin: 0.6 mg/dL (ref 0.3–1.2)
Total Protein: 6 g/dL — ABNORMAL LOW (ref 6.5–8.1)

## 2021-07-21 LAB — I-STAT CHEM 8, ED
BUN: 26 mg/dL — ABNORMAL HIGH (ref 8–23)
Calcium, Ion: 1.15 mmol/L (ref 1.15–1.40)
Chloride: 106 mmol/L (ref 98–111)
Creatinine, Ser: 1.2 mg/dL (ref 0.61–1.24)
Glucose, Bld: 106 mg/dL — ABNORMAL HIGH (ref 70–99)
HCT: 47 % (ref 39.0–52.0)
Hemoglobin: 16 g/dL (ref 13.0–17.0)
Potassium: 4.6 mmol/L (ref 3.5–5.1)
Sodium: 139 mmol/L (ref 135–145)
TCO2: 25 mmol/L (ref 22–32)

## 2021-07-21 LAB — CBC
HCT: 47.6 % (ref 39.0–52.0)
Hemoglobin: 16.3 g/dL (ref 13.0–17.0)
MCH: 33 pg (ref 26.0–34.0)
MCHC: 34.2 g/dL (ref 30.0–36.0)
MCV: 96.4 fL (ref 80.0–100.0)
Platelets: 234 10*3/uL (ref 150–400)
RBC: 4.94 MIL/uL (ref 4.22–5.81)
RDW: 13.4 % (ref 11.5–15.5)
WBC: 9 10*3/uL (ref 4.0–10.5)
nRBC: 0 % (ref 0.0–0.2)

## 2021-07-21 LAB — RAPID URINE DRUG SCREEN, HOSP PERFORMED
Amphetamines: NOT DETECTED
Barbiturates: NOT DETECTED
Benzodiazepines: NOT DETECTED
Cocaine: NOT DETECTED
Opiates: NOT DETECTED
Tetrahydrocannabinol: NOT DETECTED

## 2021-07-21 LAB — RESP PANEL BY RT-PCR (FLU A&B, COVID) ARPGX2
Influenza A by PCR: NEGATIVE
Influenza B by PCR: NEGATIVE
SARS Coronavirus 2 by RT PCR: NEGATIVE

## 2021-07-21 LAB — APTT: aPTT: 26 seconds (ref 24–36)

## 2021-07-21 LAB — TROPONIN I (HIGH SENSITIVITY)
Troponin I (High Sensitivity): 4 ng/L (ref <18)
Troponin I (High Sensitivity): 6 ng/L (ref <18)

## 2021-07-21 LAB — ETHANOL: Alcohol, Ethyl (B): <10 mg/dL (ref <10)

## 2021-07-21 MED ORDER — STROKE: EARLY STAGES OF RECOVERY BOOK
Freq: Once | Status: AC
  Filled 2021-07-21: qty 1

## 2021-07-21 MED ORDER — ASPIRIN EC 81 MG PO TBEC
81.0000 mg | DELAYED_RELEASE_TABLET | Freq: Every day | ORAL | Status: DC
  Administered 2021-07-22: 81 mg via ORAL
  Filled 2021-07-21: qty 1

## 2021-07-21 MED ORDER — PRAVASTATIN SODIUM 10 MG PO TABS: 10.0000 mg | ORAL_TABLET | Freq: Every day | ORAL | Status: DC

## 2021-07-21 MED ORDER — ACETAMINOPHEN 650 MG RE SUPP: 650.0000 mg | RECTAL | Status: DC | PRN

## 2021-07-21 MED ORDER — ACETAMINOPHEN 160 MG/5ML PO SOLN: 650.0000 mg | ORAL | Status: DC | PRN

## 2021-07-21 MED ORDER — ASPIRIN 300 MG RE SUPP: 300.0000 mg | Freq: Every day | RECTAL | Status: DC

## 2021-07-21 MED ORDER — ACETAMINOPHEN 325 MG PO TABS: 650.0000 mg | ORAL_TABLET | ORAL | Status: DC | PRN

## 2021-07-21 MED ORDER — ASPIRIN 325 MG PO TABS: 325.0000 mg | ORAL_TABLET | Freq: Every day | ORAL | Status: DC

## 2021-07-21 MED ORDER — SODIUM CHLORIDE 0.9 % IV SOLN: INTRAVENOUS | Status: DC

## 2021-07-21 NOTE — ED Notes (Signed)
Pt given sandwich bag and ice water.  

## 2021-07-21 NOTE — H&P (Signed)
Benedicto Naumann [id number] DOB: 1946/01/26 DOA: 07/21/2021     PCP: Leamon Arnt, MD   Outpatient Specialists:    Urologist  Patient arrived to ER on 07/21/21 at 1421 Referred by Attending Fredia Sorrow, MD   Patient coming from: home Lives alone,    Chief Complaint:   Chief Complaint  Patient presents with   Altered Mental Status   Chest Pain    HPI: Fumio Berard is a 75 y.o. male with medical history significant of  HLD, hx of Prostate cancer  Presented with fall and amnesia Was working in his garden after church, neighbors think he collapsed into the flower bed he was able to get and get to the [employer] at 13:10 and they called his nighbors that got to him  He appeared to be confused Was indorsing some chest pain more of the rib area Now back to being alert but still has no memory of what happened  He went to church this AM he had a committee meeting went home and got his dog out that is the last thing he remembers, but apparently he made a few phone calls, put a roast in the smoker     Initial COVID TEST  NEGATIVE    Lab Results  Component Value Date   [place] NEGATIVE 07/21/2021     Regarding pertinent Chronic problems:     Hyperlipidemia -  on statins mevacor Lipid Panel     Component Value Date/Time   CHOL 161 07/25/2020 1347   TRIG 136 07/25/2020 1347   HDL 50 07/25/2020 1347   CHOLHDL 3.2 07/25/2020 1347   VLDL 13.4 11/22/2019 1156   [place] 88 07/25/2020 1347       While in ER: CT head showing old CVA Trop neg initially       ED Triage Vitals  Enc Vitals Group     BP 07/21/21 1420 (!) 150/88     Pulse Rate 07/21/21 1420 96     Resp 07/21/21 1420 20     Temp 07/21/21 1420 98.1 F (36.7 C)     Temp Source 07/21/21 1420 Oral     SpO2 07/21/21 1420 97 %     Weight --      Height --      Head Circumference --      Peak Flow --      Pain Score 07/21/21 1424 3     Pain Loc --      Pain Edu? --      Excl. in [place]? --    TMAX(24)@     _________________________________________ Significant initial  Findings: Abnormal Labs Reviewed  COMPREHENSIVE METABOLIC PANEL - Abnormal; Notable for the following components:      Result Value   Glucose, Bld 111 (*)    Creatinine, Ser 1.27 (*)    Total Protein 6.0 (*)    GFR, Estimated 59 (*)    All other components within normal limits  I-STAT CHEM 8, ED - Abnormal; Notable for the following components:   BUN 26 (*)    Glucose, Bld 106 (*)    All other components within normal limits   ____________________________________________ Ordered CT HEAD  NON acute  CXR -  NON acute   _________________________ Troponin 4 6 ECG: Ordered Personally reviewed by me showing: HR : 93 Rhythm:  NSR, w PVC  nonspecific changes,   QTC 442 The recent clinical data is shown below. Vitals:   07/21/21 1800 07/21/21 1818  07/21/21 1930 07/21/21 2000  BP: 127/84 (!) 145/107 (!) 139/101 127/84  Pulse: 78 78 75 66  Resp: (!) 22 14 (!) 22 15  Temp:      TempSrc:      SpO2: 98% 99% 97% 93%    WBC     Component Value Date/Time   WBC 9.0 07/21/2021 1526   LYMPHSABS 0.9 07/21/2021 1526   MONOABS 0.5 07/21/2021 1526   EOSABS 0.0 07/21/2021 1526   BASOSABS 0.0 07/21/2021 1526     UA  no evidence of UTI     Urine analysis:    Component Value Date/Time   COLORURINE YELLOW 07/21/2021 [place] 07/21/2021 1526   LABSPEC 1.008 07/21/2021 1526   PHURINE 6.0 07/21/2021 1526   GLUCOSEU NEGATIVE 07/21/2021 1526   HGBUR NEGATIVE 07/21/2021 1526   BILIRUBINUR NEGATIVE 07/21/2021 1526   KETONESUR NEGATIVE 07/21/2021 1526   PROTEINUR NEGATIVE 07/21/2021 1526   NITRITE NEGATIVE 07/21/2021 1526   LEUKOCYTESUR NEGATIVE 07/21/2021 1526    Results for orders placed or performed during the hospital encounter of 07/21/21  Resp Panel by RT-PCR (Flu A&B, Covid) Nasopharyngeal Swab     Status: None   Collection Time: 07/21/21  3:26 PM   Specimen: Nasopharyngeal Swab;  Nasopharyngeal(NP) swabs in vial transport medium  Result Value Ref Range Status   SARS Coronavirus 2 by RT PCR NEGATIVE NEGATIVE Final         Influenza A by PCR NEGATIVE NEGATIVE Final   Influenza B by PCR NEGATIVE NEGATIVE Final           _______________________________________________________ ER Provider Called: NEurology    Dr. Leonel Ramsay They Recommend admit to medicine   obtain MRI, possible seizure on deferential  SEEN in ER _______________________________________________ Hospitalist was called for admission for Transient global amnesia  The following Work up has been ordered so far:  Orders Placed This Encounter  Procedures   Resp Panel by RT-PCR (Flu A&B, Covid) Nasopharyngeal Swab   CT HEAD WO CONTRAST   DG Ribs Unilateral W/Chest Left   MR Brain Wo Contrast (neuro protocol)   Ethanol   Protime-INR   APTT   CBC   Differential   Comprehensive metabolic panel   Urine rapid drug screen (hosp performed)   Urinalysis, Routine w reflex microscopic   Diet NPO time specified   Vital signs   Cardiac Monitoring   Modified Stroke Scale (mNIHSS) Document mNIHSS assessment every 2 hours for a total of 12 hours   Stroke swallow screen   Initiate Carrier Fluid Protocol   If O2 sat If O2 Sat < 94%, administer O2 at 2 liters/minute via nasal cannula.   Consult to hospitalist   Pulse oximetry, continuous   I-stat chem 8, ED   EKG 12-Lead   ED EKG   Saline lock IV    Following Medications were ordered in ER: Medications - No data to display      Consult Orders  (From admission, onward)           Start     Ordered   07/21/21 2105  Consult to hospitalist  Pg by Remo Lipps  Once       Provider:  (Not yet assigned)  Question Answer Comment  Place call to: [facility] [id number] Syncope?   Reason for Consult Admit      07/21/21 2104              OTHER Significant initial  Findings:  labs showing:  Recent Labs  Lab 07/21/21 1526 07/21/21 1619  NA  138 139  K 5.0 4.6  CO2 24  --   GLUCOSE 111* 106*  BUN 23 26*  CREATININE 1.27* 1.20  CALCIUM 9.3  --     Cr    Up from baseline see below Lab Results  Component Value Date   CREATININE 1.20 07/21/2021   CREATININE 1.27 (H) 07/21/2021   CREATININE 1.03 11/22/2019    Recent Labs  Lab 07/21/21 1526  AST 21  ALT 20  ALKPHOS 46  BILITOT 0.6  PROT 6.0*  ALBUMIN 3.5   Lab Results  Component Value Date   CALCIUM 9.3 07/21/2021           Plt: Lab Results  Component Value Date   PLT 234 07/21/2021      ABG    Component Value Date/Time   TCO2 25 07/21/2021 1619       Recent Labs  Lab 07/21/21 1526 07/21/21 1619  WBC 9.0  --   NEUTROABS 7.5  --   HGB 16.3 16.0  HCT 47.6 47.0  MCV 96.4  --   PLT 234  --     HG/HCT stable,      Component Value Date/Time   HGB 16.0 07/21/2021 1619   HCT 47.0 07/21/2021 1619   MCV 96.4 07/21/2021 1526       Cardiac Panel (last 3 results) No results for input(s): CKTOTAL, CKMB, TROPONINI, RELINDX in the last 72 hours.   BNP (last 3 results) No results for input(s): BNP in the last 8760 hours.         Cultures: No results found for: SDES, [place], CULT, REPTSTATUS   Radiological Exams on Admission: DG Ribs Unilateral W/Chest Left  Result Date: 07/21/2021 CLINICAL DATA:  Fall.  Syncope. EXAM: LEFT RIBS AND CHEST - 3+ VIEW COMPARISON:  None. FINDINGS: Heart size is normal. Lungs are clear. No pneumothorax. No acute displaced rib fractures. Chronic changes are noted in the LEFT shoulder with osteophytes along the inferior glenoid and humeral head. IMPRESSION: No evidence for acute  abnormality. Electronically Signed   By: Nolon Nations M.D.   On: 07/21/2021 16:18   CT HEAD WO CONTRAST  Result Date: 07/21/2021 CLINICAL DATA:  Neuro deficit.  Suspected stroke. EXAM: CT HEAD WITHOUT CONTRAST TECHNIQUE: Contiguous axial images were obtained from the base of the skull through the vertex without intravenous contrast.  COMPARISON:  None. FINDINGS: Brain: There mild periventricular white matter changes, consistent with small vessel disease. Small chronic lacunar infarcts are identified within the anterior limb of the RIGHT internal capsule. There is no intra or extra-axial fluid collection or mass lesion. The basilar cisterns and ventricles have a normal appearance. There is no CT evidence for acute infarction or hemorrhage. Vascular: There is minimal atherosclerotic calcification of the internal carotid arteries. No hyperdense vessels. Skull: Normal. Negative for fracture or focal lesion. Sinuses/Orbits: No acute finding. Other: None. IMPRESSION: 1. No evidence for acute intracranial abnormality. 2. Mild periventricular white matter changes of small vessel disease. 3. 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 Electronically Signed   By: Nolon Nations M.D.   On: 07/21/2021 20:50   MR Brain Wo Contrast (neuro protocol)  Result Date: 07/21/2021 CLINICAL DATA:  Memory loss and encephalopathy EXAM: MRI HEAD WITHOUT CONTRAST TECHNIQUE: Multiplanar, multiecho pulse sequences of the brain and surrounding structures were obtained without intravenous contrast. COMPARISON:  None. FINDINGS: Brain: No acute infarct, mass effect or extra-axial collection.  No acute or chronic hemorrhage. There is multifocal hyperintense T2-weighted signal within the white matter. Generalized volume loss without a clear lobar predilection. The midline structures are normal. Vascular: Major flow voids are preserved. Skull and upper cervical spine: Normal calvarium and skull base. Visualized upper cervical spine and soft tissues are normal. Sinuses/Orbits:No paranasal sinus fluid levels or advanced mucosal thickening. No mastoid or middle ear effusion. Normal orbits. IMPRESSION: 1. No acute intracranial abnormality. 2. Generalized volume loss without a clear lobar predilection. 3. Findings of chronic microvascular ischemia. Electronically  Signed   By: Ulyses Jarred M.D.   On: 07/21/2021 22:28   _______________________________________________________________________________________________________ Latest  Blood pressure 127/84, pulse 66, temperature 98.1 F (36.7 C), temperature source Oral, resp. rate 15, SpO2 93 %.   Review of Systems:    Pertinent positives include: memory loss chest pain,  Constitutional:  No weight loss, night sweats, Fevers, chills, fatigue, weight loss  HEENT:  No headaches, Difficulty swallowing,Tooth/dental problems,Sore throat,  No sneezing, itching, ear ache, nasal congestion, post nasal drip,  Cardio-vascular:  No  Orthopnea, PND, anasarca, dizziness, palpitations.no Bilateral lower extremity swelling  GI:  No heartburn, indigestion, abdominal pain, nausea, vomiting, diarrhea, change in bowel habits, loss of appetite, melena, blood in stool, hematemesis Resp:  no shortness of breath at rest. No dyspnea on exertion, No excess mucus, no productive cough, No non-productive cough, No coughing up of blood.No change in color of mucus.No wheezing. Skin:  no rash or lesions. No jaundice GU:  no dysuria, change in color of urine, no urgency or frequency. No straining to urinate.  No flank pain.  Musculoskeletal:  No joint pain or no joint swelling. No decreased range of motion. No back pain.  Psych:  No change in mood or affect. No depression or anxiety. No memory loss.  Neuro: no localizing neurological complaints, no tingling, no weakness, no double vision, no gait abnormality, no slurred speech, no confusion  All systems reviewed and apart from [place] all are negative _______________________________________________________________________________________________ Past Medical History:   Past Medical History:  Diagnosis Date   Allergy    Hyperlipidemia       Past Surgical History:  Procedure Laterality Date   APPENDECTOMY     hemorrhoidectomy     TONSILLECTOMY      Social  History:  Ambulatory   independently       reports that he has quit smoking. He has never used smokeless tobacco. He reports previous alcohol use. He reports that he does not use drugs.     Family History:   Family History  Problem Relation Age of Onset   Heart disease Father    ______________________________________________________________________________________________ Allergies: No Known Allergies   Prior to Admission medications   Medication Sig Start Date End Date Taking? Authorizing Provider  Ascorbic Acid (VITAMIN C) 1000 MG tablet Take 1,000 mg by mouth daily.    [provider]  ASPIRIN 81 PO Take by mouth.    [provider]  Calcium Carbonate (CALCIUM 600 PO) Take by mouth.    [provider]  Cholecalciferol (VITAMIN D3 MAXIMUM STRENGTH) 125 MCG (5000 UT) capsule Take 5,000 Units by mouth daily.    [provider]  docusate sodium (COLACE) 100 MG capsule Take 200 mg by mouth daily.    [provider]  Flavoring Agent (BLUEBERRY FLAVOR) LIQD by Does not apply route. Bluberry extract 600 mg    [provider]  fluticasone (FLONASE) 50 MCG/ACT nasal spray Place into both nostrils daily.  Patient not taking: Reported on 07/15/2021    [provider]  loratadine (CLARITIN) 10 MG tablet Take 10 mg by mouth daily.    [provider]  lovastatin (MEVACOR) 10 MG tablet Take 1 tablet (10 mg total) by mouth at bedtime. 09/30/20   Leamon Arnt, MD  Multiple Vitamin (MULTIVITAMIN) tablet Take 1 tablet by mouth daily.    [provider]  Omega 3-6-9 Fatty Acids (OMEGA-3 & OMEGA-6 FISH OIL PO) Take by mouth.    [provider]  Wheat Dextrin (BENEFIBER PO) Take by mouth.    [provider]    ___________________________________________________________________________________________________ Physical Exam: [id number] with BMI 07/21/2021 07/21/2021 07/21/2021  Height - - -  Weight - - -  BMI  - - -  Systolic [id number]  Diastolic 84 [id number]  Pulse 66 75 78     1. General:  in No  Acute distress   well   -appearing 2. Psychological: Alert and   Oriented 3. Head/ENT:   Dry Mucous Membranes                          Head Non traumatic, neck supple                        Poor Dentition 4. SKIN:  decreased Skin turgor,  Skin clean Dry and intact no rash 5. Heart: Regular rate and rhythm no  Murmur, no Rub or gallop 6. Lungs:   no wheezes or crackles   7. Abdomen: Soft,  non-tender, Non distended  8. Lower extremities: no clubbing, cyanosis, no  edema 9. Neurologically   strength 5 out of 5 in all 4 extremities cranial nerves II through XII intact 10. MSK: Normal range of motion    Chart has been reviewed  ______________________________________________________________________________________________  Assessment/Plan  75 y.o. male with medical history significant of  HLD, hx of Prostate cancer admitted with transient global amnesia In the setting of syncope versus seizure  Present on Admission:  Chest pain - muscular scletetal reproducible by palpation TROPONIN x2 unremarkable EKG without QT prolongation.  Continue to monitor Supportive management   Mixed hyperlipidemia - continue home meds   Transient global amnesia - - will admit based on TIA/CVA protocol, Per neurology seizure with postictal state is also on differential.       Monitor on Tele       MRA/MRI  Resulted - showing no acute ischemic CVA         Carotid Doppler        Echo to evaluate for possible syncope         obtain cardiac enzymes,  ECG,   Lipid panel, TSH.        Order PT/OT evaluation.        Passed swallow evaluation       Will make sure patient is on antiplatelet ASA  325        Allow permissive Hypertension keep BP <220/120        Neurology consulted Have seen pt in ER  Defer to neurology if needs EEG ordered Monitor for any seizure activity   Other plan as per orders.  DVT  prophylaxis:  SCD      Code Status:   DNR/DNI  as per patient  I had personally discussed CODE STATUS with patient      Family Communication:   Family not at  Bedside  Disposition Plan:      To home once workup is complete and patient is stable   Following barriers for discharge:                            Syncope work up completed                           Will need consultants to evaluate patient prior to discharge   [place] tomorrow                 Would benefit from PT/OT eval prior to DC  Ordered                     Consults called:    neurology is aware  Admission status:  ED Disposition     ED Disposition  Admit   Condition  --   Comment  The patient appears reasonably stabilized for admission considering the current resources, flow, and capabilities available in the ED at this time, and I doubt any other [facility] requiring further screening and/or treatment in the ED prior to admission is  present.           Obs    Level of care     tele  indefinitely please discontinue once patient no longer qualifies COVID-19 Labs    Lab Results  Component Value Date   [place] NEGATIVE 07/21/2021     Precautions: admitted as   Covid Negative    PPE: Used by the provider:   N95  eye Goggles,  Gloves     Briena Swingler 07/21/2021, 11:28 PM    [facility]     after 2 AM please page floor coverage PA If 7AM-7PM, please contact the day team taking care of the patient using [email or web address]   Patient was evaluated in the context of the global COVID-19 pandemic, which necessitated consideration that the patient might be at risk for infection with the SARS-CoV-2 virus that causes COVID-19. Institutional protocols and algorithms that pertain to the evaluation of patients at risk for COVID-19 are in a state of rapid change based on information released by regulatory bodies including the CDC and federal and state organizations. These policies and algorithms  were followed during the patient's care.

## 2021-07-21 NOTE — ED Notes (Signed)
EKG completed

## 2021-07-21 NOTE — Consult Note (Signed)
Neurology Consultation Reason for Consult: Memory loss Referring Physician: Roel Cluck, a  CC: Memory loss  History is obtained from: Patient  HPI: Paul Foster is a 75 y.o. male with a history of hyperlipidemia who presents with an episode of memory loss.  He remembers getting home from church, and then letting out his dog and then his memory simply shuts off.  He apparently was out in his garden at some point and had stepped on some flowers, which he feels is unusual given that he was in his underclothes.  He called several people and was acting confused and therefore he was brought into the emergency department.  He states that the first thing that he knows is that he was in this room, and has no memory of the events between letting out his dogs and being in the room.  He now feels completely back to normal.    He denies any weakness, numbness.  He has never had any staring spells or other episodes of loss time.  He denies any antecedent symptoms.    ROS: A 14 point ROS was performed and is negative except as noted in the HPI.  Past Medical History:  Diagnosis Date   Allergy    Hyperlipidemia      Family History  Problem Relation Age of Onset   Heart disease Father      Social History:  reports that he has quit smoking. He has never used smokeless tobacco. He reports previous alcohol use. He reports that he does not use drugs.   Exam: Current vital signs: BP 127/84   Pulse 66   Temp 98.1 F (36.7 C) (Oral)   Resp 15   SpO2 93%  Vital signs in last 24 hours: Temp:  [98.1 F (36.7 C)] 98.1 F (36.7 C) (08/07 1420) Pulse Rate:  [66-96] 66 (08/07 2000) Resp:  [14-28] 15 (08/07 2000) BP: (103-150)/(67-107) 127/84 (08/07 2000) SpO2:  [93 %-99 %] 93 % (08/07 2000)   Physical Exam  Constitutional: Appears well-developed and well-nourished.  Psych: Affect appropriate to situation Eyes: No scleral injection HENT: No OP obstruction MSK: no joint deformities.   Cardiovascular: Normal rate and regular rhythm.  Respiratory: Effort normal, non-labored breathing GI: Soft.  No distension. There is no tenderness.  Skin: WDI  Neuro: Mental Status: Patient is awake, alert, oriented to person, place, month, year, and situation. Patient is able to give a clear and coherent history. No signs of aphasia or neglect Cranial Nerves: II: Visual Fields are full. Pupils are equal, round, and reactive to light.   III,IV, VI: EOMI without ptosis or diploplia.  V: Facial sensation is symmetric to temperature VII: Facial movement is symmetric.  VIII: hearing is intact to voice X: Uvula elevates symmetrically XI: Shoulder shrug is symmetric. XII: tongue is midline without atrophy or fasciculations.  Motor: Tone is normal. Bulk is normal. 5/5 strength was present in all four extremities.  Sensory: Sensation is symmetric to light touch and temperature in the arms and legs. Deep Tendon Reflexes: 2+ and symmetric in the biceps and patellae.  Plantars: Toes are downgoing bilaterally.  Cerebellar: FNF and HKS are intact bilaterally    I have reviewed labs in epic and the results pertinent to this consultation are: Creatinine 1.27  I have reviewed the images obtained: MRI brain-unremarkable  Impression: 75 year old male with an amnestic episode earlier today.  The prolonged nature with preserved consciousness makes me suspect that this is transient global amnesia.  Unfortunately, I saw him  too late to get a hold of the people who spoke with him during the event, but especially if they give corroborating history that it was mainly a memory dysfunction with clear sensorium as opposed to a clouded sensorium, then I would favor this is the diagnosis.  Seizures are a less likely possibility, and I feel that neither TIA nor syncope would not result in multiple hours of amnesia with preserved consciousness.  Recommendations: 1) EEG 2) if negative I do not think I  would favor any further evaluations.   Roland Rack, MD [facility] [phone]  If 7pm- 7am, please page neurology on call as listed in [place].

## 2021-07-21 NOTE — ED Notes (Signed)
Pt returned from CT °

## 2021-07-21 NOTE — ED Provider Notes (Signed)
[facility] EMERGENCY DEPARTMENT Provider Note   CSN: [id number] Arrival date & time: 07/21/21  1421     History Chief Complaint  Patient presents with   Altered Mental Status   Chest Pain    Paul Foster is a 75 y.o. male.  Patient brought in by EMS from home.  Original report was that patient was working in the garden possibly had a syncopal episode.  Patient lives by himself.  His unit is a details of the story patient remembers being at church members being at a church meeting.  Very vague on any memory of getting home at least initially when he got here.  At made a phone call to sister-in-law [phone] seem to be very confused they called a neighbor who also may be a family member think a nephew and he came over and the patient seem to be making repetitive statements and seemed to be having memory problems.  He was complaining of some mild left rib pain.  EMS gave him 324 mg of aspirin and nitroglycerin x2 with no change in the pain.  The nephew thought that some of his flowers were crushed and he does a lot of gardening was not clear whether he was actively guarding or whether he just fell into the flower bed.  The nephew found him sitting in a chair.  And apparently this where he made the phone call from.  Patient does not remember making the phone call.  Patient states that he felt fine earlier today.  He felt fine yesterday.  Past medical history is significant for hyperlipidemia.  Denies any incontinence.  Denies any bleeding to the tongue or any cuts to the tongue.  Patient does not have a past history of seizures.  No past history of strokes.  No past history of cardiac arrhythmias.      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   Mixed hyperlipidemia 11/22/2019   Family history of premature CAD 11/22/2019   Polyp of  colon 11/22/2019   Chronic allergic rhinitis 11/22/2019   Prostate cancer ([place]) 11/22/2019    Past Surgical History:  Procedure Laterality Date   APPENDECTOMY     hemorrhoidectomy     TONSILLECTOMY         Family History  Problem Relation Age of Onset   Heart disease Father     Social History   Tobacco Use   Smoking status: Former   Smokeless tobacco: Never  Substance Use Topics   Alcohol use: Not Currently   Drug use: Never    Home Medications Prior to Admission medications   Medication Sig Start Date End Date Taking? Authorizing Provider  Ascorbic Acid (VITAMIN C) 1000 MG tablet Take 1,000 mg by mouth daily.   Yes [provider]  aspirin 81 MG EC tablet Take 81 mg by mouth daily.   Yes [provider]  Calcium Carbonate (CALCIUM 600 PO) Take 1 capsule by mouth daily.   Yes [provider]  Cholecalciferol (VITAMIN D3 MAXIMUM STRENGTH) 125 MCG (5000 UT) capsule Take 5,000 Units by mouth daily.   Yes [provider]  docusate sodium (COLACE) 100 MG capsule Take 200 mg by mouth daily.   Yes [provider]  Flavoring Agent ([place]) LIQD by Does not apply route. Bluberry extract 600  mg   Yes [provider]  fluticasone (FLONASE) 50 MCG/ACT nasal spray Place 1 spray into both nostrils daily as needed for allergies.   Yes [provider]  loratadine (CLARITIN) 10 MG tablet Take 10 mg by mouth daily.   Yes [provider]  lovastatin (MEVACOR) 10 MG tablet Take 1 tablet (10 mg total) by mouth at bedtime. 09/30/20  Yes Leamon Arnt, MD  Multiple Vitamin (MULTIVITAMIN) tablet Take 1 tablet by mouth daily.   Yes [provider]  Omega 3-6-9 Fatty Acids (OMEGA-3 & OMEGA-6 FISH OIL PO) Take 1 capsule by mouth daily.   Yes [provider]  Wheat Dextrin (BENEFIBER PO) Take by mouth.   Yes [provider]    Allergies    Patient has no known allergies.  Review of  Systems   Review of Systems  Constitutional:  Negative for chills and fever.  HENT:  Negative for ear pain and sore throat.   Eyes:  Negative for pain and visual disturbance.  Respiratory:  Negative for cough and shortness of breath.   Cardiovascular:  Positive for chest pain. Negative for palpitations.  Gastrointestinal:  Negative for abdominal pain and vomiting.  Genitourinary:  Negative for dysuria and hematuria.  Musculoskeletal:  Negative for arthralgias and back pain.  Skin:  Negative for color change and rash.  Neurological:  Negative for seizures and syncope.  All other systems reviewed and are negative.  Physical Exam Updated Vital Signs BP 127/84   Pulse 66   Temp 98.1 F (36.7 C) (Oral)   Resp 15   SpO2 93%   Physical Exam Vitals and nursing note reviewed.  Constitutional:      General: He is not in acute distress.    Appearance: Normal appearance. He is well-developed. He is not ill-appearing.  HENT:     Head: Normocephalic and atraumatic.  Eyes:     Extraocular Movements: Extraocular movements intact.     Conjunctiva/sclera: Conjunctivae normal.     Pupils: Pupils are equal, round, and reactive to light.  Cardiovascular:     Rate and Rhythm: Normal rate and regular rhythm.     Heart sounds: No murmur heard. Pulmonary:     Effort: Pulmonary effort is normal. No respiratory distress.     Breath sounds: Normal breath sounds.     Comments: Patient with tenderness to palpation to the left lateral mid rib area. Chest:     Chest wall: Tenderness present.  Abdominal:     Palpations: Abdomen is soft.     Tenderness: There is no abdominal tenderness.  Musculoskeletal:        General: No swelling or tenderness. Normal range of motion.     Cervical back: Normal range of motion and neck supple. No rigidity or tenderness.  Skin:    General: Skin is warm and dry.     Capillary Refill: Capillary refill takes less than 2 seconds.  Neurological:     General: No focal  deficit present.     Mental Status: He is alert and oriented to person, place, and time.     Cranial Nerves: No cranial nerve deficit.     Sensory: No sensory deficit.     Motor: No weakness.     Coordination: Coordination normal.    ED Results / Procedures / Treatments   Labs (all labs ordered are listed, but only abnormal results are displayed) Labs Reviewed  COMPREHENSIVE METABOLIC PANEL - Abnormal; Notable for the following components:  Result Value   Glucose, Bld 111 (*)    Creatinine, Ser 1.27 (*)    Total Protein 6.0 (*)    GFR, Estimated 59 (*)    All other components within normal limits  I-STAT CHEM 8, ED - Abnormal; Notable for the following components:   BUN 26 (*)    Glucose, Bld 106 (*)    All other components within normal limits  RESP PANEL BY RT-PCR (FLU A&B, COVID) ARPGX2  ETHANOL  PROTIME-INR  APTT  CBC  DIFFERENTIAL  RAPID URINE DRUG SCREEN, HOSP PERFORMED  URINALYSIS, ROUTINE W REFLEX MICROSCOPIC  CK  MAGNESIUM  HEMOGLOBIN A1C  LIPID PANEL  TSH  TROPONIN I (HIGH SENSITIVITY)  TROPONIN I (HIGH SENSITIVITY)    EKG EKG Interpretation  Date/Time:  Sunday July 21 2021 14:22:07 EDT Ventricular Rate:  93 PR Interval:  172 QRS Duration: 122 QT Interval:  355 QTC Calculation: 442 R Axis:   -85 Text Interpretation: Sinus rhythm Multiple ventricular premature complexes Consider left atrial enlargement Nonspecific IVCD with LAD No previous ECGs available Confirmed by Fredia Sorrow [phone]) on 07/21/2021 3:21:15 PM  Radiology DG Ribs Unilateral W/Chest Left  Result Date: 07/21/2021 CLINICAL DATA:  Fall.  Syncope. EXAM: LEFT RIBS AND CHEST - 3+ VIEW COMPARISON:  None. FINDINGS: Heart size is normal. Lungs are clear. No pneumothorax. No acute displaced rib fractures. Chronic changes are noted in the LEFT shoulder with osteophytes along the inferior glenoid and humeral head. IMPRESSION: No evidence for acute  abnormality. Electronically Signed   By:  Nolon Nations M.D.   On: 07/21/2021 16:18   CT HEAD WO CONTRAST  Result Date: 07/21/2021 CLINICAL DATA:  Neuro deficit.  Suspected stroke. EXAM: CT HEAD WITHOUT CONTRAST TECHNIQUE: Contiguous axial images were obtained from the base of the skull through the vertex without intravenous contrast. COMPARISON:  None. FINDINGS: Brain: There mild periventricular white matter changes, consistent with small vessel disease. Small chronic lacunar infarcts are identified within the anterior limb of the RIGHT internal capsule. There is no intra or extra-axial fluid collection or mass lesion. The basilar cisterns and ventricles have a normal appearance. There is no CT evidence for acute infarction or hemorrhage. Vascular: There is minimal atherosclerotic calcification of the internal carotid arteries. No hyperdense vessels. Skull: Normal. Negative for fracture or focal lesion. Sinuses/Orbits: No acute finding. Other: None. IMPRESSION: 1. No evidence for acute intracranial abnormality. 2. Mild periventricular white matter changes of small vessel disease. 3. 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 Electronically Signed   By: Nolon Nations M.D.   On: 07/21/2021 20:50   MR Brain Wo Contrast (neuro protocol)  Result Date: 07/21/2021 CLINICAL DATA:  Memory loss and encephalopathy EXAM: MRI HEAD WITHOUT CONTRAST TECHNIQUE: Multiplanar, multiecho pulse sequences of the brain and surrounding structures were obtained without intravenous contrast. COMPARISON:  None. FINDINGS: Brain: No acute infarct, mass effect or extra-axial collection. No acute or chronic hemorrhage. There is multifocal hyperintense T2-weighted signal within the white matter. Generalized volume loss without a clear lobar predilection. The midline structures are normal. Vascular: Major flow voids are preserved. Skull and upper cervical spine: Normal calvarium and skull base. Visualized upper cervical spine and soft tissues are normal.  Sinuses/Orbits:No paranasal sinus fluid levels or advanced mucosal thickening. No mastoid or middle ear effusion. Normal orbits. IMPRESSION: 1. No acute intracranial abnormality. 2. Generalized volume loss without a clear lobar predilection. 3. Findings of chronic microvascular ischemia. Electronically Signed   By: Cletus Gash.D.  On: 07/21/2021 22:28    Procedures Procedures   Medications Ordered in ED Medications  pravastatin (PRAVACHOL) tablet 10 mg (has no administration in time range)  0.9 %  sodium chloride infusion ( Intravenous New Bag/Given 07/21/21 2238)  acetaminophen (TYLENOL) tablet 650 mg (has no administration in time range)    Or  acetaminophen (TYLENOL) 160 MG/5ML solution 650 mg (has no administration in time range)    Or  acetaminophen (TYLENOL) suppository 650 mg (has no administration in time range)  aspirin suppository 300 mg (has no administration in time range)    Or  aspirin tablet 325 mg (has no administration in time range)   stroke: mapping our early stages of recovery book ( Does not apply Given 07/21/21 2239)    ED Course  I have reviewed the triage vital signs and the nursing notes.  Pertinent labs & imaging results that were available during my care of the patient were reviewed by me and considered in my medical decision making (see chart for details).    MDM Rules/Calculators/A&P                           CRITICAL CARE Performed by: Fredia Sorrow Total critical care time: 35 minutes Critical care time was exclusive of separately billable procedures and treating other patients. Critical care was necessary to treat or prevent imminent or life-threatening deterioration. Critical care was time spent personally by me on the following activities: development of treatment plan with patient and/or surrogate as well as nursing, discussions with consultants, evaluation of patient's response to treatment, examination of patient, obtaining history from  patient or surrogate, ordering and performing treatments and interventions, ordering and review of laboratory studies, ordering and review of radiographic studies, pulse oximetry and re-evaluation of patient's condition.  Patient with unknown time last normal.  So not a candidate for tPA.  But initial concerns were did have a syncopal episode was there stroke was there seizure.  Patient had CT of the head without evidence of any acute stroke or any injuries.  Also patient had an unwitnessed fall.  Evidence of some old infarct.  MRI ordered.  Troponins x2 are normal.  Electrolytes without any significant abnormalities.  Lab work-up without any acute findings.  COVID-negative.  Cardiac monitoring without any arrhythmia.  Chest x-ray with rib series without any bony abnormalities.  Tenderness to the left lateral chest is mostly chest wall in nature.  RI brain without any acute abnormalities.  No acute stroke.  Discussed with Dr. Leonel Ramsay from neurology.  Michela Pitcher most likely causes a seizure.  He will see the patient in consultation.  Hospitalist service contacted for admission.  Final Clinical Impression(s) / ED Diagnoses Final diagnoses:  Syncope, unspecified syncope type  Chest wall pain    Rx / DC Orders ED Discharge Orders     None        Fredia Sorrow, MD 07/21/21 2341

## 2021-07-21 NOTE — ED Notes (Signed)
Patient transported to X-ray 

## 2021-07-21 NOTE — ED Triage Notes (Signed)
Pt from home BIB EMS for AMS. Pt was working in the garden and possibly had a syncopal episode. Pt lives by himself and called his nephew who lives next door. Pt was disoriented when nephew arrived. Pt has no memory of what happened and is making repetitive statements. Pt c/o left rib pain. EMS gave 324 ASA and nitro x2 SL in route.

## 2021-07-22 ENCOUNTER — Observation Stay (HOSPITAL_COMMUNITY): Payer: Medicare Other

## 2021-07-22 ENCOUNTER — Observation Stay (HOSPITAL_BASED_OUTPATIENT_CLINIC_OR_DEPARTMENT_OTHER): Payer: Medicare Other

## 2021-07-22 DIAGNOSIS — R413 Other amnesia: Secondary | ICD-10-CM | POA: Diagnosis not present

## 2021-07-22 DIAGNOSIS — G459 Transient cerebral ischemic attack, unspecified: Secondary | ICD-10-CM

## 2021-07-22 DIAGNOSIS — G454 Transient global amnesia: Secondary | ICD-10-CM | POA: Diagnosis not present

## 2021-07-22 DIAGNOSIS — R55 Syncope and collapse: Secondary | ICD-10-CM | POA: Diagnosis not present

## 2021-07-22 LAB — LIPID PANEL
Cholesterol: 140 mg/dL (ref 0–200)
HDL: 42 mg/dL (ref >40)
LDL Cholesterol: 88 mg/dL (ref 0–99)
Total CHOL/HDL Ratio: 3.3 RATIO
Triglycerides: 50 mg/dL (ref <150)
VLDL: 10 mg/dL (ref 0–40)

## 2021-07-22 LAB — HEMOGLOBIN A1C
Hgb A1c MFr Bld: 5.5 % (ref 4.8–5.6)
Mean Plasma Glucose: 111.15 mg/dL

## 2021-07-22 LAB — ECHOCARDIOGRAM COMPLETE
Area-P 1/2: 2.13 cm2
S' Lateral: 2.7 cm

## 2021-07-22 LAB — TSH: TSH: 1.69 u[IU]/mL (ref 0.350–4.500)

## 2021-07-22 MED ORDER — AMLODIPINE BESYLATE 5 MG PO TABS
5.0000 mg | ORAL_TABLET | Freq: Every day | ORAL | Status: DC
  Administered 2021-07-22: 5 mg via ORAL
  Filled 2021-07-22: qty 1

## 2021-07-22 NOTE — ED Notes (Signed)
Family at bedside, pt was updated about his admitting waiting on his EEG results & his primary RN has been in contact with him regarding his D/C.

## 2021-07-22 NOTE — Progress Notes (Signed)
Neurology Progress Note  Brief HPI: 75 y.o. male with PMHx of HLD who presented to the ED 07/21/2021 for evaluation of an episode of memory loss and confusion. During his confused state, he contacted his nephew who is his neighbor who noted that he was repeating the same questions constantly and looked as if he had fallen in his garden. He returned to his baseline mental status with amnesia to the event and without residual confusion once at the hospital.   Subjective: No acute overnight events. No residual confusion  Exam: Vitals:   07/22/21 0715 07/22/21 0800  BP: (!) 166/100 (!) 141/103  Pulse: 77 88  Resp: 18 16  Temp:    SpO2: 97% 95%   Gen: Sitting up in bed, on the telephone, in no acute distress Resp: non-labored breathing, no respiratory distress on room air Abd: soft, non-tender, non-distended  Neuro: Mental Status: Awake, alert, and oriented to person, place, time, and situation.  He remains amnestic to the events leading up to hospitalization.  Speech is intact without dysarthria.  Naming, repetition, fluency, and comprehension are intact without aphasia.  No signs of neglect present.  Cranial Nerves: PERRL, visual fields are full, EOMI without ptosis or nystagmus, facial sensation is intact and symmetric to light touch, face is symmetric resting and smiling, hearing is intact to voice, palate rises symmetrically, shoulder shrug is symmetric, tongue protrudes midline Motor: 5/5 strength present in bilateral upper and lower extremities without asymmetry. Tone and bulk are normal. Sensory: Sensation to light touch is intact and symmetric in bilateral upper and lower extremities DTR: 2+ and symmetric biceps and patellae Gait: Deferred  Pertinent Labs: CBC    Component Value Date/Time   WBC 9.0 07/21/2021 1526   RBC 4.94 07/21/2021 1526   HGB 16.0 07/21/2021 1619   HCT 47.0 07/21/2021 1619   PLT 234 07/21/2021 1526   MCV 96.4 07/21/2021 1526   MCH 33.0 07/21/2021  1526   MCHC 34.2 07/21/2021 1526   RDW 13.4 07/21/2021 1526   LYMPHSABS 0.9 07/21/2021 1526   MONOABS 0.5 07/21/2021 1526   EOSABS 0.0 07/21/2021 1526   BASOSABS 0.0 07/21/2021 1526   CMP     Component Value Date/Time   NA 139 07/21/2021 1619   K 4.6 07/21/2021 1619   CL 106 07/21/2021 1619   CO2 24 07/21/2021 1526   GLUCOSE 106 (H) 07/21/2021 1619   BUN 26 (H) 07/21/2021 1619   CREATININE 1.20 07/21/2021 1619   CALCIUM 9.3 07/21/2021 1526   PROT 6.0 (L) 07/21/2021 1526   ALBUMIN 3.5 07/21/2021 1526   AST 21 07/21/2021 1526   ALT 20 07/21/2021 1526   ALKPHOS 46 07/21/2021 1526   BILITOT 0.6 07/21/2021 1526   GFRNONAA 59 (L) 07/21/2021 1526   Urinalysis    Component Value Date/Time   COLORURINE YELLOW 07/21/2021 1526   APPEARANCEUR CLEAR 07/21/2021 1526   LABSPEC 1.008 07/21/2021 1526   PHURINE 6.0 07/21/2021 1526   GLUCOSEU NEGATIVE 07/21/2021 1526   HGBUR NEGATIVE 07/21/2021 1526   BILIRUBINUR NEGATIVE 07/21/2021 1526   KETONESUR NEGATIVE 07/21/2021 1526   PROTEINUR NEGATIVE 07/21/2021 1526   NITRITE NEGATIVE 07/21/2021 1526   LEUKOCYTESUR NEGATIVE 07/21/2021 1526   Drugs of Abuse     Component Value Date/Time   LABOPIA NONE DETECTED 07/21/2021 1526   COCAINSCRNUR NONE DETECTED 07/21/2021 1526   LABBENZ NONE DETECTED 07/21/2021 1526   AMPHETMU NONE DETECTED 07/21/2021 1526   THCU NONE DETECTED 07/21/2021 1526   LABBARB NONE DETECTED  07/21/2021 1526    Lab Results  Component Value Date   TSH 1.690 07/22/2021   Imaging Reviewed:  CT Head without contrast 07/21/2021: 1. No evidence for acute intracranial abnormality. 2. Mild periventricular white matter changes of small vessel disease. 3. 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  MRI Brain without contrast 07/21/2021: 1. No acute intracranial abnormality. 2. Generalized volume loss without a clear lobar predilection. 3. Findings of chronic microvascular ischemia.  Assessment:  75 year old male with an amnestic episode prior to evaluation on 07/21/2021.  The prolonged nature with preserved consciousness makes me suspect that this is transient global amnesia. Per his nephew who is his neighbor, patient had a clear sensorium with repetitive questioning and memory dysfunction. Patient's nephew states that the patient was able to reason that he must have fallen in the garden due to his pants being dirty but that he was unable to recall falling. His nephew states that the patient's speech may have slightly been slurred but believes that his speech was more consistent with pressured speech due to anxiety and confusion. He did not note any extremity weakness or facial asymmetry.    Seizures are a less likely possibility, and I feel that neither TIA nor syncope would not result in multiple hours of amnesia with preserved consciousness.  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 Follow-up with outpatient neurology Plan relayed to Dr. Tawanna Solo  -- Amie Portland, MD Neurologist [facility] Pager: [phone]

## 2021-07-22 NOTE — Progress Notes (Signed)
PT Cancellation Note  Patient Details Name: Paul Foster MRN: [id number] DOB: January 19, 1946   Cancelled Treatment:    Reason Eval/Treat Not Completed: Other (comment);PT screened, no needs identified, will sign off.  Screen is negative, please re-contact PT if anything changes.   Ramond Dial 07/22/2021, 1:10 PM  Mee Hives, PT MS Acute Rehab Dept. Number: [place] [facility] [place]

## 2021-07-22 NOTE — Procedures (Signed)
Patient Name: Jaan Filar  MRN: [id number]  Epilepsy Attending: Lora Havens  Referring Physician/Provider: Anibal Henderson, NP Date: 07/22/2021 Duration: 23.09 mins  Patient history:  75 year old male with an amnestic episode.  EEG to evaluate for seizures.  Level of alertness: Awake, asleep  AEDs during EEG study: None  Technical aspects: This EEG study was done with scalp electrodes positioned according to the 10-20 International system of electrode placement. Electrical activity was acquired at a sampling rate of '500Hz'$  and reviewed with a high frequency filter of '70Hz'$  and a low frequency filter of '1Hz'$ . EEG data were recorded continuously and digitally stored.   Description: The posterior dominant rhythm consists of 8.5 Hz activity of moderate voltage (25-35 uV) seen predominantly in posterior head regions, symmetric and reactive to eye opening and eye closing.  Sleep was characterized by vertex waves, sleep spindles (12 to 14 Hz), maximal frontocentral region. Hyperventilation and photic stimulation were not performed.     IMPRESSION: This study is within normal limits. No seizures or epileptiform discharges were seen throughout the recording.   Silvio Sausedo Barbra Sarks

## 2021-07-22 NOTE — ED Notes (Signed)
Patient moved to room with report received from previous RN. Patient with ABCs intact, alert and oriented x4, respirations even and unlabored, NSR on monitor. Patient walked to restroom with steady gait. Denies dizziness. Back to stretcher, reconnected to monitor, bed in low locked position with side rails up x2. Call bell in reach, no needs at this time.

## 2021-07-22 NOTE — Progress Notes (Signed)
EEG complete - results pending 

## 2021-07-22 NOTE — Progress Notes (Signed)
  Echocardiogram 2D Echocardiogram has been performed.  Paul Foster 07/22/2021, 10:38 AM

## 2021-07-22 NOTE — Evaluation (Signed)
Occupational Therapy Evaluation Patient Details Name: Paul Foster MRN: [id number] DOB: 1946/02/10 Today's Date: 07/22/2021    History of Present Illness Paul Foster is a 75 y.o. male with medical history significant of  HLD, hx of Prostate cancer presented with fall and amnesia. CT/MRi: Negative. Per neurology possible transient global amnesia.   Clinical Impression   This 76 yo male admitted due to above presents to acute OT at an overall independent level with basic ADLs. No further OT needs and no PT needs identified, we will sign off.    Follow Up Recommendations  No OT follow up                 Precautions / Restrictions Precautions Precautions: None Restrictions Weight Bearing Restrictions: No      Mobility Bed Mobility Overal bed mobility: Independent                  Transfers Overall transfer level: Independent                    Balance Overall balance assessment: Independent                                         ADL either performed or assessed with clinical judgement   ADL Overall ADL's : Independent                                             Vision Baseline Vision/History: Wears glasses Wears Glasses: At all times Patient Visual Report: No change from baseline              Pertinent Vitals/Pain Pain Assessment: No/denies pain     Hand Dominance Right   Extremity/Trunk Assessment Upper Extremity Assessment Upper Extremity Assessment: Overall WFL for tasks assessed   Lower Extremity Assessment Lower Extremity Assessment: Overall WFL for tasks assessed       Communication Communication Communication: No difficulties   Cognition Arousal/Alertness: Awake/alert Behavior During Therapy: WFL for tasks assessed/performed Overall Cognitive Status: Within Functional Limits for tasks assessed                                     General Comments  Can ambulate with head  turns/movements without LOB, reach down and pick up item from floor without LOB, prop foot up on stool to tie laces while standing without LOB.            Home Living Family/patient expects to be discharged to:: Private residence Living Arrangements: Alone Available Help at Discharge: Family;Friend(s);Available PRN/intermittently Type of Home: House Home Access: Ramped entrance               Bathroom Toilet: Standard     Home Equipment: None   Additional Comments: Very active at home and in community      Prior Functioning/Environment Level of Independence: Independent                 OT Problem List: Other (comment) (none found)      OT Treatment/Interventions:      OT Goals(Current goals can be found in the care plan section) Acute Rehab OT Goals Patient Stated Goal: to go home and  get something good to eat   AM-PAC OT "6 Clicks" Daily Activity     Outcome Measure Help from another person eating meals?: None Help from another person taking care of personal grooming?: None Help from another person toileting, which includes using toliet, bedpan, or urinal?: None Help from another person bathing (including washing, rinsing, drying)?: None Help from another person to put on and taking off regular upper body clothing?: None Help from another person to put on and taking off regular lower body clothing?: None 6 Click Score: 24   End of Session    Activity Tolerance: Patient tolerated treatment well Patient left: in chair  OT Visit Diagnosis: History of falling (Z91.81)                Time: [id number] OT Time Calculation (min): 16 min Charges:  OT General Charges $OT Visit: 1 Visit OT Evaluation $OT Eval Low Complexity: 1 Low  [name], OTR/L Acute [employer] Pager [phone] Office [phone]    Almon Register 07/22/2021, 11:21 AM

## 2021-07-22 NOTE — Discharge Summary (Signed)
Physician Discharge Summary  Paul Foster [id number] DOB: 23-Nov-1946 DOA: 07/21/2021  PCP: Leamon Arnt, MD  Admit date: 07/21/2021 Discharge date: 07/22/2021  Admitted From: Home Disposition:  Home  Discharge Condition:Stable CODE STATUS:FULL, DNR, Comfort Care Diet recommendation: Heart Healthy / Carb Modified / Regular / Dysphagia   Brief/Interim Summary: HPI (Dr Roel Cluck): Paul Foster is a 75 y.o. male with medical history significant of  HLD, hx of Prostate cancer   Presented with fall and amnesia Was working in his garden after church, neighbors think he collapsed into the flower bed he was able to get and get to the [employer] at 13:10 and they called his nighbors that got to him He appeared to be confused Was indorsing some chest pain more of the rib area Now back to being alert but still has no memory of what happened   He went to church this AM he had a committee meeting went home and got his dog out that is the last thing he remembers, but apparently he made a few phone calls, put a roast in the smoker    Hospital Course:  Patient was admitted for the syncopal/seizure/stroke work-up.  Neurology was consulted.  Extensive work-up done.  MRI of the brain did not show any acute intracranial abnormalities but showed generalized volume loss without a clear lobar predilection,findings of chronic microvascular ischemia.  EEG did not show any seizure.  Neurology cleared for DC.  Suspected transient global amnesia. Echo showed normal left ventricular function.  PT/OT did not recommend any follow-up. Patient seen and examined at the bedside this afternoon.  He is completely alert and oriented and ready to go home.  He does not have any complaints or any pertinent clinical findings.  He is medically stable for discharge home today.  We recommend to follow-up with neurology as an outpatient.      Discharge Diagnoses:  Active Problems:   Mixed hyperlipidemia   Chest  pain   Transient global amnesia    Discharge Instructions  Discharge Instructions     Ambulatory referral to Neurology   Complete by: As directed    An appointment is requested in approximately: 4 weeks   Diet - low sodium heart healthy   Complete by: As directed    Discharge instructions   Complete by: As directed    1)Please follow up with your PCP in a week 2)Follow up with neurology as an outpatient. Name and number of the provider group has been attached   Increase activity slowly   Complete by: As directed       Allergies as of 07/22/2021   No Known Allergies      Medication List     TAKE these medications    aspirin 81 MG EC tablet Take 81 mg by mouth daily.   BENEFIBER PO Take by mouth.   Blueberry Flavor Liqd by Does not apply route. Bluberry extract 600 mg   CALCIUM 600 PO Take 1 capsule by mouth daily.   docusate sodium 100 MG capsule Commonly known as: COLACE Take 200 mg by mouth daily.   fluticasone 50 MCG/ACT nasal spray Commonly known as: FLONASE Place 1 spray into both nostrils daily as needed for allergies.   loratadine 10 MG tablet Commonly known as: CLARITIN Take 10 mg by mouth daily.   lovastatin 10 MG tablet Commonly known as: MEVACOR Take 1 tablet (10 mg total) by mouth at bedtime.   multivitamin tablet Take 1 tablet by mouth daily.  OMEGA-3 & OMEGA-6 FISH OIL PO Take 1 capsule by mouth daily.   vitamin C 1000 MG tablet Take 1,000 mg by mouth daily.   Vitamin D3 Maximum Strength 125 MCG (5000 UT) capsule Generic drug: Cholecalciferol Take 5,000 Units by mouth daily.        Follow-up Information     Leamon Arnt, MD. Schedule an appointment as soon as possible for a visit in 1 week(s).   Specialty: Family Medicine Contact information: [place] [address] [phone]         [facility]. Schedule an appointment as soon as possible for a visit in 4 week(s).    Specialty: Neurology Contact information: [address] [place] [id number] [phone]               No Known Allergies  Consultations: Neurology   Procedures/Studies: DG Ribs Unilateral W/Chest Left  Result Date: 07/21/2021 CLINICAL DATA:  Fall.  Syncope. EXAM: LEFT RIBS AND CHEST - 3+ VIEW COMPARISON:  None. FINDINGS: Heart size is normal. Lungs are clear. No pneumothorax. No acute displaced rib fractures. Chronic changes are noted in the LEFT shoulder with osteophytes along the inferior glenoid and humeral head. IMPRESSION: No evidence for acute  abnormality. Electronically Signed   By: Nolon Nations M.D.   On: 07/21/2021 16:18   CT HEAD WO CONTRAST  Result Date: 07/21/2021 CLINICAL DATA:  Neuro deficit.  Suspected stroke. EXAM: CT HEAD WITHOUT CONTRAST TECHNIQUE: Contiguous axial images were obtained from the base of the skull through the vertex without intravenous contrast. COMPARISON:  None. FINDINGS: Brain: There mild periventricular white matter changes, consistent with small vessel disease. Small chronic lacunar infarcts are identified within the anterior limb of the RIGHT internal capsule. There is no intra or extra-axial fluid collection or mass lesion. The basilar cisterns and ventricles have a normal appearance. There is no CT evidence for acute infarction or hemorrhage. Vascular: There is minimal atherosclerotic calcification of the internal carotid arteries. No hyperdense vessels. Skull: Normal. Negative for fracture or focal lesion. Sinuses/Orbits: No acute finding. Other: None. IMPRESSION: 1. No evidence for acute intracranial abnormality. 2. Mild periventricular white matter changes of small vessel disease. 3. 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 Electronically Signed   By: Nolon Nations M.D.   On: 07/21/2021 20:50   MR Brain Wo Contrast (neuro protocol)  Result Date: 07/21/2021 CLINICAL DATA:  Memory loss and  encephalopathy EXAM: MRI HEAD WITHOUT CONTRAST TECHNIQUE: Multiplanar, multiecho pulse sequences of the brain and surrounding structures were obtained without intravenous contrast. COMPARISON:  None. FINDINGS: Brain: No acute infarct, mass effect or extra-axial collection. No acute or chronic hemorrhage. There is multifocal hyperintense T2-weighted signal within the white matter. Generalized volume loss without a clear lobar predilection. The midline structures are normal. Vascular: Major flow voids are preserved. Skull and upper cervical spine: Normal calvarium and skull base. Visualized upper cervical spine and soft tissues are normal. Sinuses/Orbits:No paranasal sinus fluid levels or advanced mucosal thickening. No mastoid or middle ear effusion. Normal orbits. IMPRESSION: 1. No acute intracranial abnormality. 2. Generalized volume loss without a clear lobar predilection. 3. Findings of chronic microvascular ischemia. Electronically Signed   By: Ulyses Jarred M.D.   On: 07/21/2021 22:28   EEG adult  Result Date: 07/22/2021 Lora Havens, MD     07/22/2021 10:29 AM Patient Name: Paul Foster MRN: [id number] Epilepsy Attending: Lora Havens Referring Physician/Provider: Anibal Henderson, NP Date:  07/22/2021 Duration: 23.09 mins Patient history:  75 year old male with an amnestic episode.  EEG to evaluate for seizures. Level of alertness: Awake, asleep AEDs during EEG study: None Technical aspects: This EEG study was done with scalp electrodes positioned according to the 10-20 International system of electrode placement. Electrical activity was acquired at a sampling rate of '500Hz'$  and reviewed with a high frequency filter of '70Hz'$  and a low frequency filter of '1Hz'$ . EEG data were recorded continuously and digitally stored. Description: The posterior dominant rhythm consists of 8.5 Hz activity of moderate voltage (25-35 uV) seen predominantly in posterior head regions, symmetric and reactive to eye opening and eye  closing.  Sleep was characterized by vertex waves, sleep spindles (12 to 14 Hz), maximal frontocentral region. Hyperventilation and photic stimulation were not performed.   IMPRESSION: This study is within normal limits. No seizures or epileptiform discharges were seen throughout the recording. Lora Havens   ECHOCARDIOGRAM COMPLETE  Result Date: 07/22/2021    ECHOCARDIOGRAM REPORT   Patient Name:   Paul Foster Date of Exam: 07/22/2021 Medical Rec #:  [id number]   Height:       73.0 in Accession #:    [id number]  Weight:       207.8 lb Date of Birth:  04-11-1946   BSA:          2.187 m Patient Age:    50 years    BP:           166/100 mmHg Patient Gender: M           HR:           82 bpm. Exam Location:  Inpatient Procedure: 2D Echo, Cardiac Doppler and Color Doppler Indications:    TIA  History:        Patient has no prior history of Echocardiogram examinations.                 Risk Factors:Dyslipidemia.  Sonographer:    Bernadene Person RDCS Referring Phys: [place]  1. Left ventricular ejection fraction, by estimation, is 60 to 65%. The left ventricle has normal function. The left ventricle has no regional wall motion abnormalities. Left ventricular diastolic parameters are consistent with Grade I diastolic dysfunction (impaired relaxation).  2. Right ventricular systolic function is normal. The right ventricular size is normal.  3. The mitral valve is normal in structure. Trivial mitral valve regurgitation. No evidence of mitral stenosis.  4. The aortic valve is tricuspid. Aortic valve regurgitation is not visualized. Mild aortic valve sclerosis is present, with no evidence of aortic valve stenosis.  5. Aortic dilatation noted. There is mild dilatation of the ascending aorta, measuring 43 mm. FINDINGS  Left Ventricle: Left ventricular ejection fraction, by estimation, is 60 to 65%. The left ventricle has normal function. The left ventricle has no regional wall motion abnormalities.  The left ventricular internal cavity size was normal in size. There is  no left ventricular hypertrophy. Left ventricular diastolic parameters are consistent with Grade I diastolic dysfunction (impaired relaxation). Right Ventricle: The right ventricular size is normal. Right ventricular systolic function is normal. Left Atrium: Left atrial size was normal in size. Right Atrium: Right atrial size was normal in size. Pericardium: There is no evidence of pericardial effusion. Mitral Valve: The mitral valve is normal in structure. Trivial mitral valve regurgitation. No evidence of mitral valve stenosis. Tricuspid Valve: The tricuspid valve is normal in structure. Tricuspid valve regurgitation is trivial. No evidence of  tricuspid stenosis. Aortic Valve: The aortic valve is tricuspid. Aortic valve regurgitation is not visualized. Mild aortic valve sclerosis is present, with no evidence of aortic valve stenosis. Pulmonic Valve: The pulmonic valve was not well visualized. Pulmonic valve regurgitation is trivial. No evidence of pulmonic stenosis. Aorta: Aortic dilatation noted. There is mild dilatation of the ascending aorta, measuring 43 mm. Venous: The inferior vena cava was not well visualized. IAS/Shunts: No atrial level shunt detected by color flow Doppler.  LEFT VENTRICLE PLAX 2D LVIDd:         4.90 cm  Diastology LVIDs:         2.70 cm  LV e' medial:    4.47 cm/s LV PW:         1.00 cm  LV E/e' medial:  11.8 LV IVS:        0.90 cm  LV e' lateral:   4.82 cm/s LVOT diam:     2.10 cm  LV E/e' lateral: 10.9 LV SV:         45 LV SV Index:   20 LVOT Area:     3.46 cm  RIGHT VENTRICLE RV S prime:     19.30 cm/s TAPSE (M-mode): 2.7 cm LEFT ATRIUM             Index       RIGHT ATRIUM           Index LA diam:        2.50 cm 1.14 cm/m  RA Area:     21.70 cm LA Vol (A2C):   58.1 ml 26.57 ml/m RA Volume:   57.70 ml  26.38 ml/m LA Vol (A4C):   58.3 ml 26.66 ml/m LA Biplane Vol: 59.1 ml 27.02 ml/m  AORTIC VALVE              PULMONIC VALVE LVOT Vmax:   88.67 cm/s  PR End Diast Vel: 5.11 msec LVOT Vmean:  50.700 cm/s LVOT VTI:    0.129 m  AORTA Ao Root diam: 3.60 cm Ao Asc diam:  4.30 cm MITRAL VALVE               TRICUSPID VALVE MV Area (PHT): 2.13 cm    TR Peak grad:   21.7 mmHg MV Decel Time: 356 msec    TR Vmax:        233.00 cm/s MV E velocity: 52.70 cm/s MV A velocity: 64.30 cm/s  SHUNTS MV E/A ratio:  0.82        Systemic VTI:  0.13 m                            Systemic Diam: 2.10 cm Kirk Ruths MD Electronically signed by Kirk Ruths MD Signature Date/Time: 07/22/2021/12:34:13 PM    Final       Subjective: Patient seen and examined bedside this afternoon.  Medically stable for discharge today.  Discharge Exam: Vitals:   07/22/21 1030 07/22/21 1100  BP: (!) 158/89 140/81  Pulse: 80 (!) 109  Resp: 19 (!) 31  Temp:    SpO2: 100% 98%   Vitals:   07/22/21 0930 07/22/21 1000 07/22/21 1030 07/22/21 1100  BP: (!) 141/94 132/85 (!) 158/89 140/81  Pulse: 81 75 80 (!) 109  Resp: '13 18 19 '$ (!) 31  Temp:      TempSrc:      SpO2: 98% 98% 100% 98%    General: Pt is alert, awake, not  in acute distress Cardiovascular: RRR, S1/S2 +, no rubs, no gallops Respiratory: CTA bilaterally, no wheezing, no rhonchi Abdominal: Soft, NT, ND, bowel sounds + Extremities: no edema, no cyanosis    The results of significant diagnostics from this hospitalization (including imaging, microbiology, ancillary and laboratory) are listed below for reference.     Microbiology: Recent Results (from the past 240 hour(s))  Resp Panel by RT-PCR (Flu A&B, Covid) Nasopharyngeal Swab     Status: None   Collection Time: 07/21/21  3:26 PM   Specimen: Nasopharyngeal Swab; Nasopharyngeal(NP) swabs in vial transport medium  Result Value Ref Range Status   SARS Coronavirus 2 by RT PCR NEGATIVE NEGATIVE Final    Comment: (NOTE) SARS-CoV-2 target nucleic acids are NOT DETECTED.  The SARS-CoV-2 RNA is generally detectable in upper  respiratory specimens during the acute phase of infection. The lowest concentration of SARS-CoV-2 viral copies this assay can detect is 138 copies/mL. A negative result does not preclude SARS-Cov-2 infection and should not be used as the sole basis for treatment or other patient management decisions. A negative result may occur with  improper specimen collection/handling, submission of specimen other than nasopharyngeal swab, presence of viral mutation(s) within the areas targeted by this assay, and inadequate number of viral copies(<138 copies/mL). A negative result must be combined with clinical observations, patient history, and epidemiological information. The expected result is Negative.  Fact Sheet for Patients:  EntrepreneurPulse.com.au  Fact Sheet for Healthcare Providers:  IncredibleEmployment.be  This test is no t yet approved or cleared by the [place] FDA and  has been authorized for detection and/or diagnosis of SARS-CoV-2 by FDA under an Emergency Use Authorization (EUA). This EUA will remain  in effect (meaning this test can be used) for the duration of the COVID-19 declaration under Section 564(b)(1) of the Act, 21 U.S.C.section 360bbb-3(b)(1), unless the authorization is terminated  or revoked sooner.       Influenza A by PCR NEGATIVE NEGATIVE Final   Influenza B by PCR NEGATIVE NEGATIVE Final    Comment: (NOTE) The Xpert Xpress SARS-CoV-2/FLU/RSV plus assay is intended as an aid in the diagnosis of influenza from Nasopharyngeal swab specimens and should not be used as a sole basis for treatment. Nasal washings and aspirates are unacceptable for Xpert Xpress SARS-CoV-2/FLU/RSV testing.  Fact Sheet for Patients: EntrepreneurPulse.com.au  Fact Sheet for Healthcare Providers: IncredibleEmployment.be  This test is not yet approved or cleared by the [place] FDA and has been  authorized for detection and/or diagnosis of SARS-CoV-2 by FDA under an Emergency Use Authorization (EUA). This EUA will remain in effect (meaning this test can be used) for the duration of the COVID-19 declaration under Section 564(b)(1) of the Act, 21 U.S.C. section 360bbb-3(b)(1), unless the authorization is terminated or revoked.  Performed at [facility], [place] [address] [place], [place] [address]      Labs: BNP (last 3 results) No results for input(s): BNP in the last 8760 hours. Basic Metabolic Panel: Recent Labs  Lab 07/21/21 1526 07/21/21 1619  NA 138 139  K 5.0 4.6  CL 108 106  CO2 24  --   GLUCOSE 111* 106*  BUN 23 26*  CREATININE 1.27* 1.20  CALCIUM 9.3  --    Liver Function Tests: Recent Labs  Lab 07/21/21 1526  AST 21  ALT 20  ALKPHOS 46  BILITOT 0.6  PROT 6.0*  ALBUMIN 3.5   No results for input(s): LIPASE, AMYLASE in the last 168 hours. No  results for input(s): AMMONIA in the last 168 hours. CBC: Recent Labs  Lab 07/21/21 1526 07/21/21 1619  WBC 9.0  --   NEUTROABS 7.5  --   HGB 16.3 16.0  HCT 47.6 47.0  MCV 96.4  --   PLT 234  --    Cardiac Enzymes: No results for input(s): CKTOTAL, CKMB, CKMBINDEX, TROPONINI in the last 168 hours. BNP: Invalid input(s): POCBNP CBG: No results for input(s): GLUCAP in the last 168 hours. D-Dimer No results for input(s): DDIMER in the last 72 hours. Hgb A1c Recent Labs    07/22/21 0448  HGBA1C 5.5   Lipid Profile Recent Labs    07/22/21 0449  CHOL 140  HDL 42  LDLCALC 88  TRIG 50  CHOLHDL 3.3   Thyroid function studies Recent Labs    07/22/21 0449  TSH 1.690   Anemia work up No results for input(s): VITAMINB12, FOLATE, FERRITIN, TIBC, IRON, RETICCTPCT in the last 72 hours. Urinalysis    Component Value Date/Time   COLORURINE YELLOW 07/21/2021 1526   APPEARANCEUR CLEAR 07/21/2021 1526   LABSPEC 1.008 07/21/2021 1526   PHURINE 6.0 07/21/2021 1526   GLUCOSEU NEGATIVE  07/21/2021 1526   HGBUR NEGATIVE 07/21/2021 [place] 07/21/2021 [place] 07/21/2021 1526   PROTEINUR NEGATIVE 07/21/2021 1526   NITRITE NEGATIVE 07/21/2021 1526   LEUKOCYTESUR NEGATIVE 07/21/2021 1526   Sepsis Labs Invalid input(s): PROCALCITONIN,  WBC,  LACTICIDVEN Microbiology Recent Results (from the past 240 hour(s))  Resp Panel by RT-PCR (Flu A&B, Covid) Nasopharyngeal Swab     Status: None   Collection Time: 07/21/21  3:26 PM   Specimen: Nasopharyngeal Swab; Nasopharyngeal(NP) swabs in vial transport medium  Result Value Ref Range Status   SARS Coronavirus 2 by RT PCR NEGATIVE NEGATIVE Final    Comment: (NOTE) SARS-CoV-2 target nucleic acids are NOT DETECTED.  The SARS-CoV-2 RNA is generally detectable in upper respiratory specimens during the acute phase of infection. The lowest concentration of SARS-CoV-2 viral copies this assay can detect is 138 copies/mL. A negative result does not preclude SARS-Cov-2 infection and should not be used as the sole basis for treatment or other patient management decisions. A negative result may occur with  improper specimen collection/handling, submission of specimen other than nasopharyngeal swab, presence of viral mutation(s) within the areas targeted by this assay, and inadequate number of viral copies(<138 copies/mL). A negative result must be combined with clinical observations, patient history, and epidemiological information. The expected result is Negative.  Fact Sheet for Patients:  EntrepreneurPulse.com.au  Fact Sheet for Healthcare Providers:  IncredibleEmployment.be  This test is no t yet approved or cleared by the [place] FDA and  has been authorized for detection and/or diagnosis of SARS-CoV-2 by FDA under an Emergency Use Authorization (EUA). This EUA will remain  in effect (meaning this test can be used) for the duration of the COVID-19  declaration under Section 564(b)(1) of the Act, 21 U.S.C.section 360bbb-3(b)(1), unless the authorization is terminated  or revoked sooner.       Influenza A by PCR NEGATIVE NEGATIVE Final   Influenza B by PCR NEGATIVE NEGATIVE Final    Comment: (NOTE) The Xpert Xpress SARS-CoV-2/FLU/RSV plus assay is intended as an aid in the diagnosis of influenza from Nasopharyngeal swab specimens and should not be used as a sole basis for treatment. Nasal washings and aspirates are unacceptable for Xpert Xpress SARS-CoV-2/FLU/RSV testing.  Fact Sheet for Patients: EntrepreneurPulse.com.au  Fact Sheet for Healthcare  Providers: IncredibleEmployment.be  This test is not yet approved or cleared by the [place] and has been authorized for detection and/or diagnosis of SARS-CoV-2 by FDA under an Emergency Use Authorization (EUA). This EUA will remain in effect (meaning this test can be used) for the duration of the COVID-19 declaration under Section 564(b)(1) of the Act, 21 U.S.C. section 360bbb-3(b)(1), unless the authorization is terminated or revoked.  Performed at [facility], [place] [address], [place], [place] [address]     Please note: You were cared for by a hospitalist during your hospital stay. Once you are discharged, your primary care physician will handle any further medical issues. Please note that NO REFILLS for any discharge medications will be authorized once you are discharged, as it is imperative that you return to your primary care physician (or establish a relationship with a primary care physician if you do not have one) for your post hospital discharge needs so that they can reassess your need for medications and monitor your lab values.    Time coordinating discharge: 40 minutes  SIGNED:   Shelly Coss, MD  [facility] 07/22/2021, 2:03 PM Pager [phone]  If 7PM-7AM, please contact  night-coverage [email or web address] Password [email or web address]

## 2021-07-22 NOTE — Progress Notes (Signed)
PT Cancellation Note  Patient Details Name: Paul Foster MRN: [id number] DOB: Jan 12, 1946   Cancelled Treatment:    Reason Eval/Treat Not Completed: Other (comment).  In echocardiogram and will retry at another time.   Ramond Dial 07/22/2021, 9:51 AM  Mee Hives, PT MS Acute Rehab Dept. Number: [place]

## 2021-07-22 NOTE — ED Notes (Signed)
D/C papers was given AVS papers, all D/C teachings were explained/understood. Son was at bedside in assistance to take pt home. All VSS, no c/o pain, ambulating well, pt D/C at this time.

## 2021-07-25 ENCOUNTER — Ambulatory Visit (INDEPENDENT_AMBULATORY_CARE_PROVIDER_SITE_OTHER): Payer: Medicare Other | Admitting: Family Medicine

## 2021-07-25 ENCOUNTER — Encounter: Payer: Self-pay | Admitting: Family Medicine

## 2021-07-25 ENCOUNTER — Other Ambulatory Visit: Payer: Self-pay

## 2021-07-25 ENCOUNTER — Ambulatory Visit (INDEPENDENT_AMBULATORY_CARE_PROVIDER_SITE_OTHER): Payer: Medicare Other

## 2021-07-25 VITALS — BP 107/64 | HR 82 | Temp 97.3°F | Ht 73.0 in | Wt 203.8 lb

## 2021-07-25 DIAGNOSIS — E782 Mixed hyperlipidemia: Secondary | ICD-10-CM

## 2021-07-25 DIAGNOSIS — G454 Transient global amnesia: Secondary | ICD-10-CM | POA: Diagnosis not present

## 2021-07-25 DIAGNOSIS — R001 Bradycardia, unspecified: Secondary | ICD-10-CM

## 2021-07-25 DIAGNOSIS — R55 Syncope and collapse: Secondary | ICD-10-CM | POA: Diagnosis not present

## 2021-07-25 NOTE — Patient Instructions (Signed)
Please return in January for your physical.  I've ordered a 14 day heart monitor to ensure there is no arrhythmia that caused your episode.  Please follow up with neurology as well.   If you have any questions or concerns, please don't hesitate to send me a message via MyChart or call the office at [phone]. Thank you for visiting with [place] today! It's our pleasure caring for you.
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
## 2021-07-25 NOTE — Progress Notes (Unsigned)
Patient enrolled for [employer] to mail a 14 day ZIO XT monitor to his address on file.

## 2021-07-28 DIAGNOSIS — R55 Syncope and collapse: Secondary | ICD-10-CM | POA: Diagnosis not present

## 2021-07-28 DIAGNOSIS — R001 Bradycardia, unspecified: Secondary | ICD-10-CM | POA: Diagnosis not present

## 2021-09-12 ENCOUNTER — Encounter: Payer: Self-pay | Admitting: Neurology

## 2021-09-12 ENCOUNTER — Ambulatory Visit (INDEPENDENT_AMBULATORY_CARE_PROVIDER_SITE_OTHER): Payer: Medicare Other | Admitting: Neurology

## 2021-09-12 VITALS — BP 147/83 | HR 80 | Ht 73.0 in | Wt 206.5 lb

## 2021-09-12 DIAGNOSIS — G454 Transient global amnesia: Secondary | ICD-10-CM | POA: Diagnosis not present
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
## 2021-09-12 NOTE — Patient Instructions (Signed)
Continue current medications  Return as needed  

## 2021-09-20 ENCOUNTER — Other Ambulatory Visit: Payer: Self-pay | Admitting: Family Medicine

## 2022-03-26 IMAGING — CT CT HEAD W/O CM
4 series · 16 of 47 positions shown, 18 images · non-contrast
Comparison: None.

CLINICAL DATA: Neuro deficit.  Suspected stroke.

EXAM:
CT HEAD WITHOUT CONTRAST
TECHNIQUE: Contiguous axial images were obtained from the base of the skull
through the vertex without intravenous contrast.

[Series 3: head wo · axial · 0.46mm/px · z∈[+1257,+1387]mm · 7 of 36 slices shown, 9 images]
[im 5/36  brain]
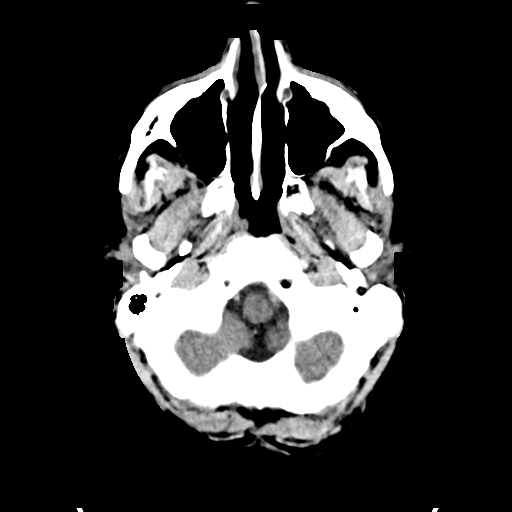
[im 5/36  bone]
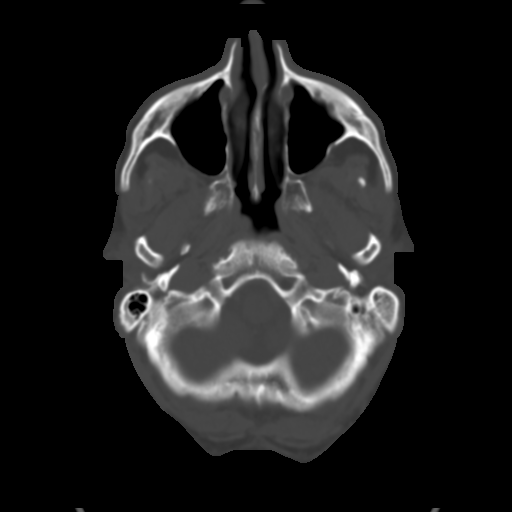
[im 9/36  brain]
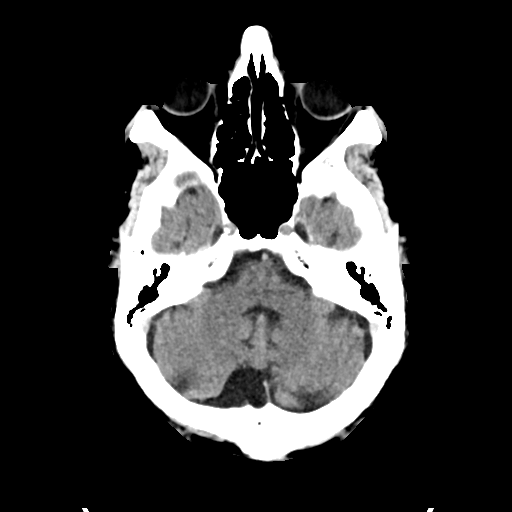
[im 14/36  brain]
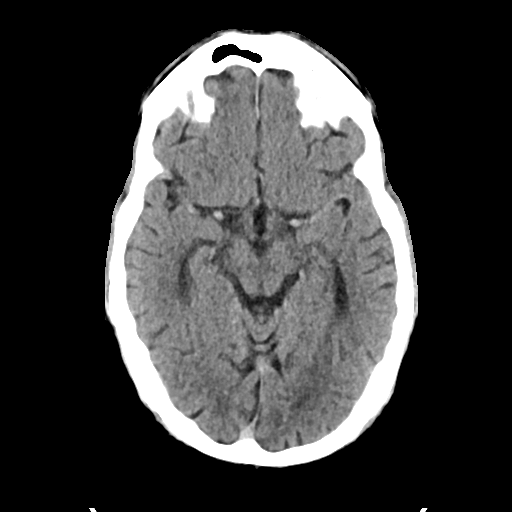
[im 18/36  brain]
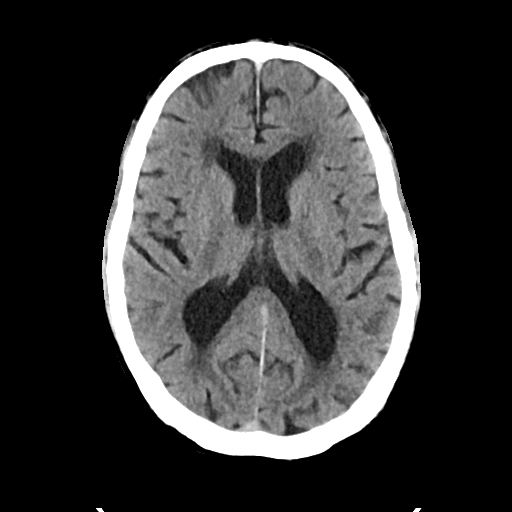
[im 22/36  brain]
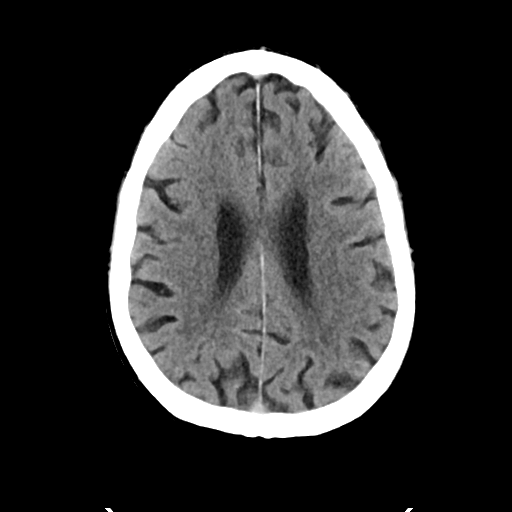
[im 22/36  bone]
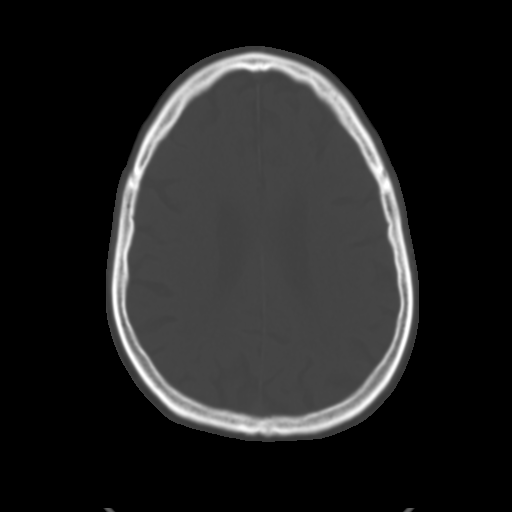
[im 27/36  brain]
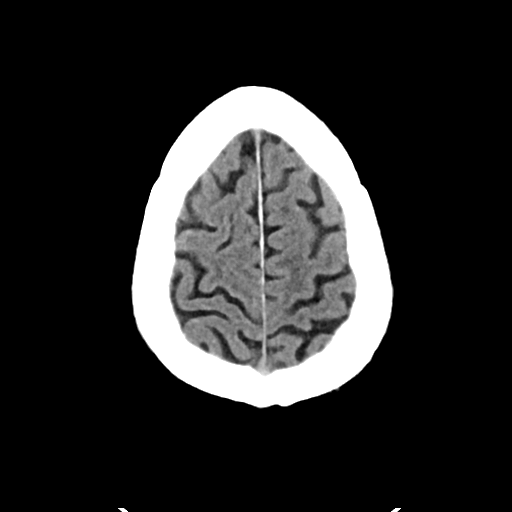
[im 31/36  brain]
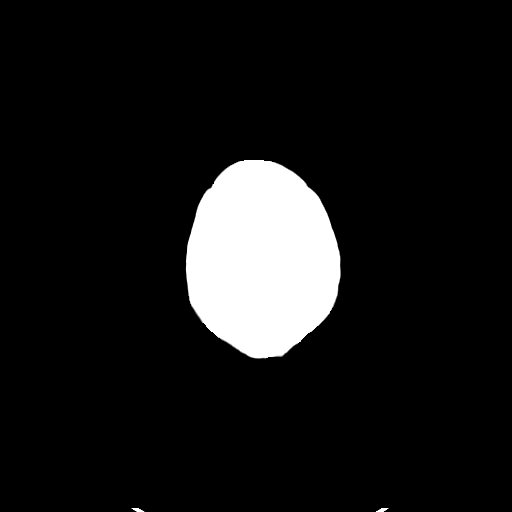

[Series 4: head bone · axial · 0.46mm/px · z∈[+1253,+1289]mm · 3 of 89 slices shown]
[im 9/89  bone]
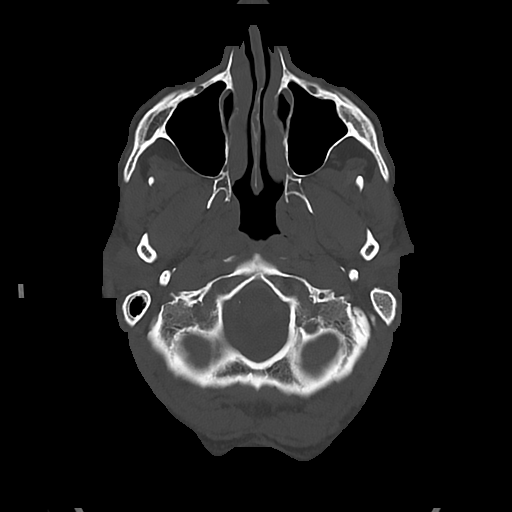
[im 18/89  bone]
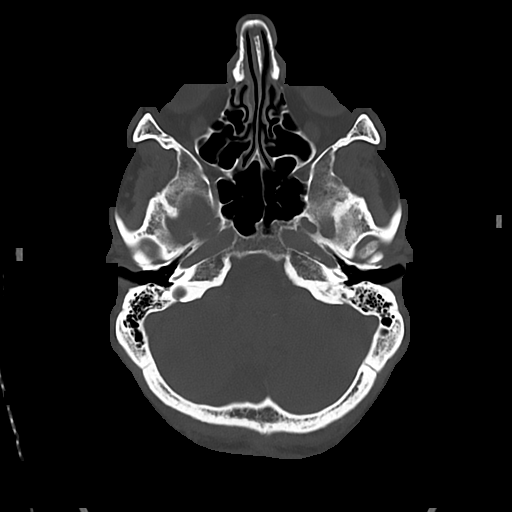
[im 27/89  bone]
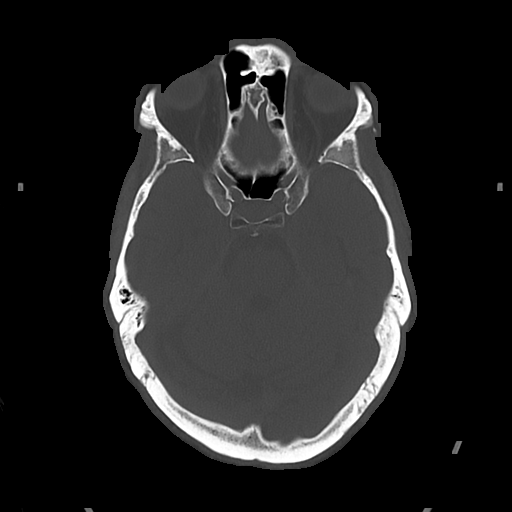

[Series 5: cor soft · coronal · 0.34mm/px · 3 of 76 slices shown]
[im 26/76  brain]
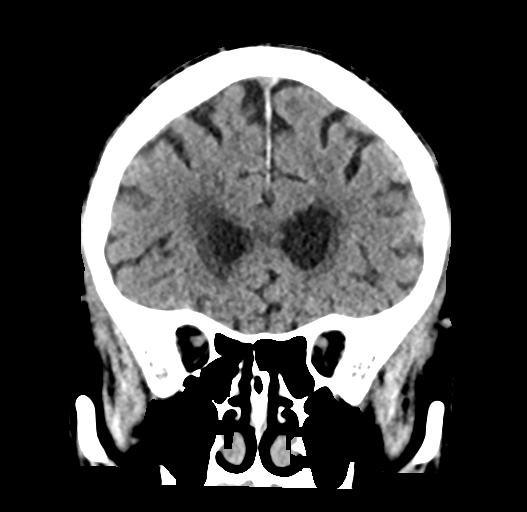
[im 34/76  brain]
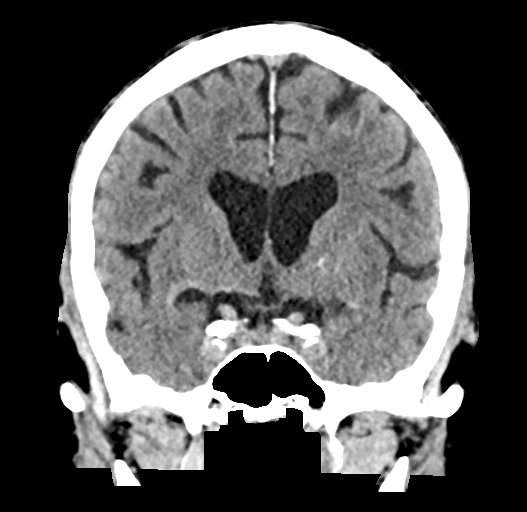
[im 42/76  brain]
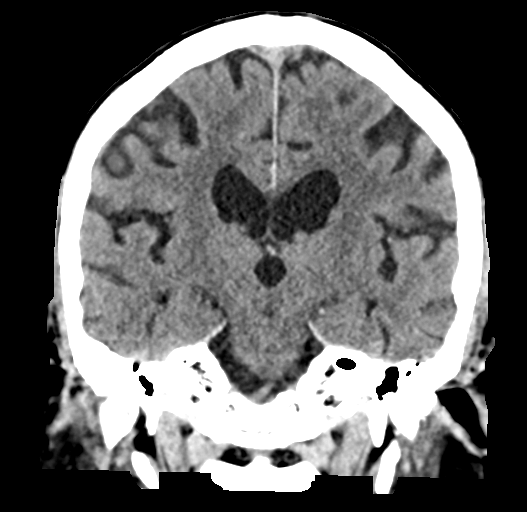

[Series 6: sag soft · sagittal · 0.35mm/px · 3 of 62 slices shown]
[im 21/62  brain]
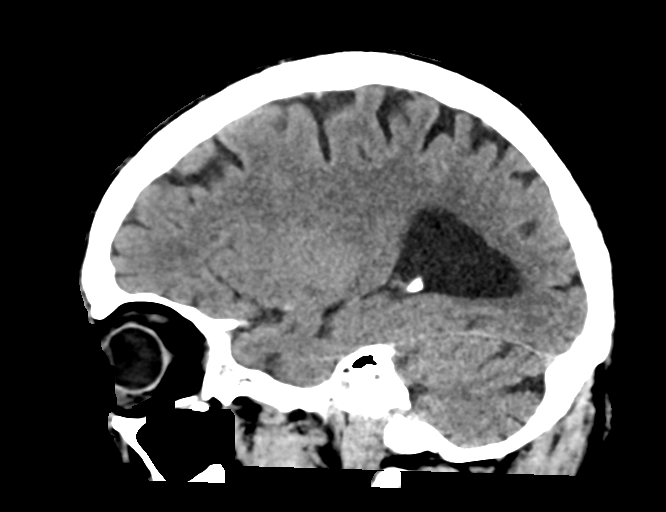
[im 31/62  brain]
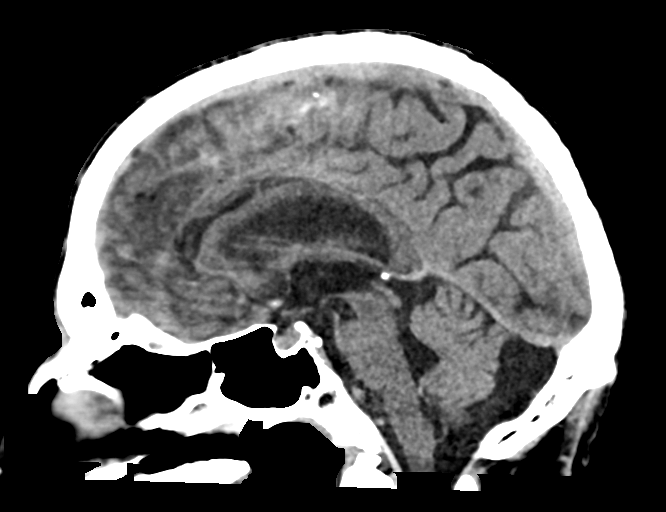
[im 41/62  brain]
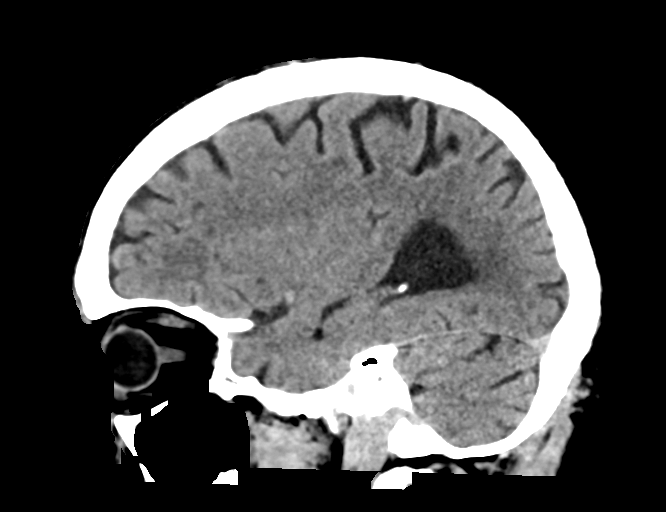

[16 of 47 positions shown; findings below may reference images not displayed]

FINDINGS: Brain: There mild periventricular white matter changes, consistent
with small vessel disease. Small chronic lacunar infarcts are
identified within the anterior limb of the RIGHT internal capsule.
There is no intra or extra-axial fluid collection or mass lesion.
The basilar cisterns and ventricles have a normal appearance. There
is no CT evidence for acute infarction or hemorrhage.

Vascular: There is minimal atherosclerotic calcification of the
internal carotid arteries. No hyperdense vessels.

Skull: Normal. Negative for fracture or focal lesion.

Sinuses/Orbits: No acute finding.

Other: None.
IMPRESSION: 1. No evidence for acute intracranial abnormality.
2. Mild periventricular white matter changes of small vessel
disease.
3. Small chronic lacunar infarcts of the RIGHT internal capsule.

## 2022-07-21 ENCOUNTER — Ambulatory Visit (INDEPENDENT_AMBULATORY_CARE_PROVIDER_SITE_OTHER): Payer: Medicare Other

## 2022-07-21 VITALS — BP 134/72 | HR 72 | Temp 97.6°F | Wt 199.0 lb

## 2022-07-21 DIAGNOSIS — Z1211 Encounter for screening for malignant neoplasm of colon: Secondary | ICD-10-CM | POA: Diagnosis not present

## 2022-07-21 DIAGNOSIS — Z0000 Encounter for general adult medical examination without abnormal findings: Secondary | ICD-10-CM

## 2022-07-21 NOTE — Patient Instructions (Signed)
[name] , Thank you for taking time to come for your Medicare Wellness Visit. I appreciate your ongoing commitment to your health goals. Please review the following plan we discussed and let me know if I can assist you in the future.   Screening recommendations/referrals: Colonoscopy: order placed 07/21/22 Recommended yearly ophthalmology/optometry visit for glaucoma screening and checkup Recommended yearly dental visit for hygiene and checkup  Vaccinations: Influenza vaccine: due  Pneumococcal vaccine: Up to date Tdap vaccine: done 01/06/19 repeat every 10 years  Shingles vaccine: completed 10/07/18, 01/06/19   Covid-19: completed 2/5, 3/2, 09/18/20  Advanced directives: Please bring a copy of your health care power of attorney and living will to the office at your convenience.  Conditions/risks identified: maintain health and stay active   Next appointment: Follow up in one year for your annual wellness visit.   Preventive Care 44 Years and Older, Male Preventive care refers to lifestyle choices and visits with your health care provider that can promote health and wellness. What does preventive care include? A yearly physical exam. This is also called an annual well check. Dental exams once or twice a year. Routine eye exams. Ask your health care provider how often you should have your eyes checked. Personal lifestyle choices, including: Daily care of your teeth and gums. Regular physical activity. Eating a healthy diet. Avoiding tobacco and drug use. Limiting alcohol use. Practicing safe sex. Taking low doses of aspirin every day. Taking vitamin and mineral supplements as recommended by your health care provider. What happens during an annual well check? The services and screenings done by your health care provider during your annual well check will depend on your age, overall health, lifestyle risk factors, and family history of disease. Counseling  Your health care provider  may ask you questions about your: Alcohol use. Tobacco use. Drug use. Emotional well-being. Home and relationship well-being. Sexual activity. Eating habits. History of falls. Memory and ability to understand (cognition). Work and work Statistician. Screening  You may have the following tests or measurements: Height, weight, and BMI. Blood pressure. Lipid and cholesterol levels. These may be checked every 5 years, or more frequently if you are over 58 years old. Skin check. Lung cancer screening. You may have this screening every year starting at age 29 if you have a 30-pack-year history of smoking and currently smoke or have quit within the past 15 years. Fecal occult blood test (FOBT) of the stool. You may have this test every year starting at age 34. Flexible sigmoidoscopy or colonoscopy. You may have a sigmoidoscopy every 5 years or a colonoscopy every 10 years starting at age 85. Prostate cancer screening. Recommendations will vary depending on your family history and other risks. Hepatitis C blood test. Hepatitis B blood test. Sexually transmitted disease (STD) testing. Diabetes screening. This is done by checking your blood sugar (glucose) after you have not eaten for a while (fasting). You may have this done every 1-3 years. Abdominal aortic aneurysm (AAA) screening. You may need this if you are a current or former smoker. Osteoporosis. You may be screened starting at age 17 if you are at high risk. Talk with your health care provider about your test results, treatment options, and if necessary, the need for more tests. Vaccines  Your health care provider may recommend certain vaccines, such as: Influenza vaccine. This is recommended every year. Tetanus, diphtheria, and acellular pertussis (Tdap, Td) vaccine. You may need a Td booster every 10 years. Zoster vaccine. You may  need this after age 38. Pneumococcal 13-valent conjugate (PCV13) vaccine. One dose is recommended after  age 90. Pneumococcal polysaccharide (PPSV23) vaccine. One dose is recommended after age 70. Talk to your health care provider about which screenings and vaccines you need and how often you need them. This information is not intended to replace advice given to you by your health care provider. Make sure you discuss any questions you have with your health care provider. Document Released: 12/28/2015 Document Revised: 08/20/2016 Document Reviewed: 10/02/2015 Elsevier Interactive Patient Education  2017 [place] Prevention in the Home Falls can cause injuries. They can happen to people of all ages. There are many things you can do to make your home safe and to help prevent falls. What can I do on the outside of my home? Regularly fix the edges of walkways and driveways and fix any cracks. Remove anything that might make you trip as you walk through a door, such as a raised step or threshold. Trim any bushes or trees on the path to your home. Use bright outdoor lighting. Clear any walking paths of anything that might make someone trip, such as rocks or tools. Regularly check to see if handrails are loose or broken. Make sure that both sides of any steps have handrails. Any raised decks and porches should have guardrails on the edges. Have any leaves, snow, or ice cleared regularly. Use sand or salt on walking paths during winter. Clean up any spills in your garage right away. This includes oil or grease spills. What can I do in the bathroom? Use night lights. Install grab bars by the toilet and in the tub and shower. Do not use towel bars as grab bars. Use non-skid mats or decals in the tub or shower. If you need to sit down in the shower, use a plastic, non-slip stool. Keep the floor dry. Clean up any water that spills on the floor as soon as it happens. Remove soap buildup in the tub or shower regularly. Attach bath mats securely with double-sided non-slip rug tape. Do not have  throw rugs and other things on the floor that can make you trip. What can I do in the bedroom? Use night lights. Make sure that you have a light by your bed that is easy to reach. Do not use any sheets or blankets that are too big for your bed. They should not hang down onto the floor. Have a firm chair that has side arms. You can use this for support while you get dressed. Do not have throw rugs and other things on the floor that can make you trip. What can I do in the kitchen? Clean up any spills right away. Avoid walking on wet floors. Keep items that you use a lot in easy-to-reach places. If you need to reach something above you, use a strong step stool that has a grab bar. Keep electrical cords out of the way. Do not use floor polish or wax that makes floors slippery. If you must use wax, use non-skid floor wax. Do not have throw rugs and other things on the floor that can make you trip. What can I do with my stairs? Do not leave any items on the stairs. Make sure that there are handrails on both sides of the stairs and use them. Fix handrails that are broken or loose. Make sure that handrails are as long as the stairways. Check any carpeting to make sure that it is firmly attached  to the stairs. Fix any carpet that is loose or worn. Avoid having throw rugs at the top or bottom of the stairs. If you do have throw rugs, attach them to the floor with carpet tape. Make sure that you have a light switch at the top of the stairs and the bottom of the stairs. If you do not have them, ask someone to add them for you. What else can I do to help prevent falls? Wear shoes that: Do not have high heels. Have rubber bottoms. Are comfortable and fit you well. Are closed at the toe. Do not wear sandals. If you use a stepladder: Make sure that it is fully opened. Do not climb a closed stepladder. Make sure that both sides of the stepladder are locked into place. Ask someone to hold it for you, if  possible. Clearly mark and make sure that you can see: Any grab bars or handrails. First and last steps. Where the edge of each step is. Use tools that help you move around (mobility aids) if they are needed. These include: Canes. Walkers. Scooters. Crutches. Turn on the lights when you go into a dark area. Replace any light bulbs as soon as they burn out. Set up your furniture so you have a clear path. Avoid moving your furniture around. If any of your floors are uneven, fix them. If there are any pets around you, be aware of where they are. Review your medicines with your doctor. Some medicines can make you feel dizzy. This can increase your chance of falling. Ask your doctor what other things that you can do to help prevent falls. This information is not intended to replace advice given to you by your health care provider. Make sure you discuss any questions you have with your health care provider. Document Released: 09/27/2009 Document Revised: 05/08/2016 Document Reviewed: 01/05/2015 Elsevier Interactive Patient Education  2017 [employer].

## 2022-07-21 NOTE — Progress Notes (Addendum)
Subjective:   Paul Foster is a 76 y.o. male who presents for Medicare Annual/Subsequent preventive examination.  Review of Systems     Cardiac Risk Factors include: advanced age (>9mn, >>68women);dyslipidemia;male gender     Objective:    Today's Vitals   07/21/22 0805 07/21/22 0824  BP: (!) 142/78 134/72  Pulse: 72   Temp: 97.6 F (36.4 C)   SpO2: 99%   Weight: 199 lb (90.3 kg)    Body mass index is 26.25 kg/m.     07/21/2022    8:11 AM 07/15/2021   11:07 AM  Advanced Directives  Does Patient Have a Medical Advance Directive? Yes Yes  Type of [place]aramed[place]of [place] will [place]illin Chart? No - copy requested No - copy requested    Current Medications (verified) Outpatient Encounter Medications as of 07/21/2022  Medication Sig   Ascorbic Acid (VITAMIN C) 1000 MG tablet Take 1,000 mg by mouth daily.   aspirin 81 MG EC tablet Take 81 mg by mouth daily.   Calcium Carbonate (CALCIUM 600 PO) Take 1 capsule by mouth daily.   Cholecalciferol (VITAMIN D3 MAXIMUM STRENGTH) 125 MCG (5000 UT) capsule Take 5,000 Units by mouth daily.   docusate sodium (COLACE) 100 MG capsule Take 200 mg by mouth daily.   Flavoring Agent (BLUEBERRY FLAVOR) LIQD by Does not apply route. Bluberry extract 600 mg   fluticasone (FLONASE) 50 MCG/ACT nasal spray Place 1 spray into both nostrils daily as needed for allergies.   loratadine (CLARITIN) 10 MG tablet Take 10 mg by mouth daily.   lovastatin (MEVACOR) 10 MG tablet TAKE 1 TABLET AT BEDTIME   Multiple Vitamin (MULTIVITAMIN) tablet Take 1 tablet by mouth daily.   Omega 3-6-9 Fatty Acids (OMEGA-3 & OMEGA-6 FISH OIL PO) Take 1 capsule by mouth daily.   Wheat Dextrin (BENEFIBER PO) Take by mouth.   No facility-administered encounter medications on file as of 07/21/2022.    Allergies (verified) Patient has no known allergies.   History: Past Medical History:   Diagnosis Date   Allergy    Hyperlipidemia    TGA (transient global amnesia)    Past Surgical History:  Procedure Laterality Date   APPENDECTOMY     hemorrhoidectomy     TONSILLECTOMY     Family History  Problem Relation Age of Onset   Heart attack Mother    Heart attack Father    Heart attack Brother    Heart attack Brother    Social History   Socioeconomic History   Marital status: Widowed    Spouse name: [place]  Number of children: 0   Years of education: some college   Highest education level: Not on file  Occupational History   Occupation: Retired  Tobacco Use   Smoking status: Former   Smokeless tobacco: Never   Tobacco comments:    Quit 1990  Substance and Sexual Activity   Alcohol use: Yes    Comment: social   Drug use: Never   Sexual activity: Not Currently  Other Topics Concern   Not on file  Social History Narrative   Wife, VPayam Gribble passed July 02, 2020.   Lives alone.   Right-handed.   Caffeine use: 40 ounces per day (1/2 caff - 1/ decaf coffee)   Social Determinants of Health   Financial Resource Strain: Low Risk  (07/21/2022)   Overall Financial Resource Strain (CARDIA)  Difficulty of Paying Living Expenses: Not hard at all  Food Insecurity: No Food Insecurity (07/21/2022)   Hunger Vital Sign    Worried About Running Out of Food in the Last Year: Never true    Ran Out of Food in the Last Year: Never true  Transportation Needs: No Transportation Needs (07/21/2022)   PRAPARE - Hydrologist (Medical): No    Lack of Transportation (Non-Medical): No  Physical Activity: Sufficiently Active (07/21/2022)   Exercise Vital Sign    Days of Exercise per Week: 7 days    Minutes of Exercise per Session: 30 min  Stress: No Stress Concern Present (07/21/2022)   [place]    Feeling of Stress : Not at all  Social Connections: Moderately Isolated (07/21/2022)    Social Connection and Isolation Panel [NHANES]    Frequency of Communication with Friends and Family: More than three times a week    Frequency of Social Gatherings with Friends and Family: More than three times a week    Attends Religious Services: More than 4 times per year    Active Member of [employer] or Organizations: No    Attends Archivist Meetings: Never    Marital Status: Widowed    Tobacco Counseling Counseling given: Not Answered Tobacco comments: Quit 1990   Clinical Intake:  Pre-visit preparation completed: Yes  Pain : No/denies pain     BMI - recorded: 26.25 Nutritional Status: BMI 25 -29 Overweight Nutritional Risks: None Diabetes: No  How often do you need to have someone help you when you read instructions, pamphlets, or other written materials from your doctor or pharmacy?: 1 - Never  Diabetic?no  Interpreter Needed?: No  Information entered by :: Charlott Rakes, LPN   Activities of Daily Living    07/21/2022    8:12 AM  In your present state of health, do you have any difficulty performing the following activities:  Hearing? 0  Vision? 0  Difficulty concentrating or making decisions? 0  Walking or climbing stairs? 0  Dressing or bathing? 0  Doing errands, shopping? 0  Preparing Food and eating ? N  Using the Toilet? N  In the past six months, have you accidently leaked urine? N  Do you have problems with loss of bowel control? N  Managing your Medications? N  Managing your Finances? N  Housekeeping or managing your Housekeeping? N    Patient Care Team: Leamon Arnt, MD as PCP - General (Family Medicine)  Indicate any recent Medical Services you may have received from other than [facility] providers in the past year (date may be approximate).     Assessment:   This is a routine wellness examination for Paul Foster.  Hearing/Vision screen Hearing Screening - Comments:: Pt denies any hearing issues  Vision Screening - Comments:: Pt  follows with provider @ [facility]  for annual eye exams   Dietary issues and exercise activities discussed: Current Exercise Habits: Home exercise routine, Type of exercise: stretching, Time (Minutes): 30, Frequency (Times/Week): 7, Weekly Exercise (Minutes/Week): 210   Goals Addressed             This Visit's Progress    Patient Stated       Maintain health and activity        Depression Screen    07/21/2022    8:09 AM 07/15/2021   11:06 AM 11/22/2019   10:54 AM  PHQ 2/9 Scores  PHQ - 2 Score 0 0 0    Fall Risk    07/21/2022    8:12 AM 07/15/2021   11:09 AM  Fall Risk   Falls in the past year? 0 0  Number falls in past yr: 0 0  Injury with Fall? 0 0  Risk for fall due to : Impaired vision Impaired vision  Follow up Falls prevention discussed Falls prevention discussed    FALL RISK PREVENTION PERTAINING TO THE HOME:  Any stairs in or around the home? Yes  If so, are there any without handrails? No  Home free of loose throw rugs in walkways, pet beds, electrical cords, etc? Yes  Adequate lighting in your home to reduce risk of falls? Yes   ASSISTIVE DEVICES UTILIZED TO PREVENT FALLS:  Life alert? No  Use of a cane, walker or w/c? No  Grab bars in the bathroom? Yes  Shower chair or bench in shower? Yes  Elevated toilet seat or a handicapped toilet? Yes   TIMED UP AND GO:  Was the test performed? Yes .  Length of time to ambulate 10 feet: 10 sec.   Gait steady and fast without use of assistive device  Cognitive Function:        07/21/2022    8:13 AM 07/15/2021   11:12 AM  6CIT Screen  What Year? 0 points 0 points  What month? 0 points 0 points  What time? 0 points 0 points  Count back from 20 0 points 0 points  Months in reverse 0 points 0 points  Repeat phrase 0 points 2 points  Total Score 0 points 2 points    Immunizations Immunization History  Administered Date(s) Administered   Hepatitis B 03/09/1992   Influenza-Unspecified  10/07/2018, 08/30/2019, 09/17/2020   PFIZER(Purple Top)SARS-COV-2 Vaccination 01/20/2020, 02/14/2020, 09/18/2020   Pneumococcal Conjugate-13 10/28/2016   Pneumococcal Polysaccharide-23 10/07/2018   Tdap 01/06/2019   Zoster Recombinat (Shingrix) 10/07/2018, 01/06/2019    TDAP status: Up to date  Flu Vaccine status: Due, Education has been provided regarding the importance of this vaccine. Advised may receive this vaccine at local pharmacy or Health Dept. Aware to provide a copy of the vaccination record if obtained from local pharmacy or Health Dept. Verbalized acceptance and understanding.  Pneumococcal vaccine status: Up to date  Covid-19 vaccine status: Completed vaccines  Qualifies for Shingles Vaccine? Yes   Zostavax completed Yes   Shingrix Completed?: Yes  Screening Tests Health Maintenance  Topic Date Due   COVID-19 Vaccine (4 - Booster for Pfizer series) 11/13/2020   COLONOSCOPY (Pts 45-41yr Insurance coverage will need to be confirmed)  04/06/2022   INFLUENZA VACCINE  07/15/2022   TETANUS/TDAP  01/06/2029   Pneumonia Vaccine 76 Years old  Completed   Zoster Vaccines- Shingrix  Completed   HPV VACCINES  Aged Out   Hepatitis C Screening  Discontinued    Health Maintenance  Health Maintenance Due  Topic Date Due   COVID-19 Vaccine (4 - Booster for PEast [employer]eries) 11/13/2020   COLONOSCOPY (Pts 45-479yrInsurance coverage will need to be confirmed)  04/06/2022   INFLUENZA VACCINE  07/15/2022    Colorectal cancer screening: Referral to GI placed 07/21/22. Pt aware the office will call re: appt.  Additional Screening:  Hepatitis C Screening: does not qualify;   Vision Screening: Recommended annual ophthalmology exams for early detection of glaucoma and other disorders of the eye. Is the patient up to date with their annual eye exam?  Yes  Who is  the provider or what is the name of the office in which the patient attends annual eye exams? [facility]  If  pt is not established with a provider, would they like to be referred to a provider to establish care? No .   Dental Screening: Recommended annual dental exams for proper oral hygiene  Community Resource Referral / Chronic Care Management: CRR required this visit?  No   CCM required this visit?  No      Plan:     I have personally reviewed and noted the following in the patient's chart:   Medical and social history Use of alcohol, tobacco or illicit drugs  Current medications and supplements including opioid prescriptions. Patient is not currently taking opioid prescriptions. Functional ability and status Nutritional status Physical activity Advanced directives List of other physicians Hospitalizations, surgeries, and ER visits in previous 12 months Vitals Screenings to include cognitive, depression, and falls Referrals and appointments  In addition, I have reviewed and discussed with patient certain preventive protocols, quality metrics, and best practice recommendations. A written personalized care plan for preventive services as well as general preventive health recommendations were provided to patient.     Willette Brace, LPN   3/0/1601   Nurse Notes: pt left blood pressure recordings at the front desk, Pt stated he never received a heart monitor that was talked about at an appt he stated, he is also requesting a refil for the Flonase nasal spray please advise

## 2022-08-13 ENCOUNTER — Encounter: Payer: Self-pay | Admitting: Family Medicine

## 2022-08-13 ENCOUNTER — Ambulatory Visit (INDEPENDENT_AMBULATORY_CARE_PROVIDER_SITE_OTHER): Payer: Medicare Other | Admitting: Family Medicine

## 2022-08-13 VITALS — BP 128/80 | HR 69 | Temp 98.8°F | Ht 73.0 in | Wt 199.8 lb

## 2022-08-13 DIAGNOSIS — C61 Malignant neoplasm of prostate: Secondary | ICD-10-CM

## 2022-08-13 DIAGNOSIS — Z8249 Family history of ischemic heart disease and other diseases of the circulatory system: Secondary | ICD-10-CM

## 2022-08-13 DIAGNOSIS — G454 Transient global amnesia: Secondary | ICD-10-CM

## 2022-08-13 DIAGNOSIS — E782 Mixed hyperlipidemia: Secondary | ICD-10-CM

## 2022-08-13 DIAGNOSIS — K635 Polyp of colon: Secondary | ICD-10-CM

## 2022-08-13 DIAGNOSIS — J309 Allergic rhinitis, unspecified: Secondary | ICD-10-CM

## 2022-08-13 LAB — COMPREHENSIVE METABOLIC PANEL
ALT: 14 U/L (ref 0–53)
AST: 15 U/L (ref 0–37)
Albumin: 3.9 g/dL (ref 3.5–5.2)
Alkaline Phosphatase: 48 U/L (ref 39–117)
BUN: 14 mg/dL (ref 6–23)
CO2: 23 mEq/L (ref 19–32)
Calcium: 9.2 mg/dL (ref 8.4–10.5)
Chloride: 106 mEq/L (ref 96–112)
Creatinine, Ser: 1.08 mg/dL (ref 0.40–1.50)
GFR: 66 mL/min (ref >60.00)
Glucose, Bld: 67 mg/dL — ABNORMAL LOW (ref 70–99)
Potassium: 3.9 mEq/L (ref 3.5–5.1)
Sodium: 139 mEq/L (ref 135–145)
Total Bilirubin: 0.9 mg/dL (ref 0.2–1.2)
Total Protein: 6.5 g/dL (ref 6.0–8.3)

## 2022-08-13 LAB — CBC WITH DIFFERENTIAL/PLATELET
Basophils Absolute: 0 10*3/uL (ref 0.0–0.1)
Basophils Relative: 0.7 % (ref 0.0–3.0)
Eosinophils Absolute: 0.1 10*3/uL (ref 0.0–0.7)
Eosinophils Relative: 2.2 % (ref 0.0–5.0)
HCT: 48.7 % (ref 39.0–52.0)
Hemoglobin: 16.4 g/dL (ref 13.0–17.0)
Lymphocytes Relative: 32.7 % (ref 12.0–46.0)
Lymphs Abs: 1.6 10*3/uL (ref 0.7–4.0)
MCHC: 33.7 g/dL (ref 30.0–36.0)
MCV: 98.4 fl (ref 78.0–100.0)
Monocytes Absolute: 0.5 10*3/uL (ref 0.1–1.0)
Monocytes Relative: 9.7 % (ref 3.0–12.0)
Neutro Abs: 2.6 10*3/uL (ref 1.4–7.7)
Neutrophils Relative %: 54.7 % (ref 43.0–77.0)
Platelets: 229 10*3/uL (ref 150.0–400.0)
RBC: 4.95 Mil/uL (ref 4.22–5.81)
RDW: 14.2 % (ref 11.5–15.5)
WBC: 4.8 10*3/uL (ref 4.0–10.5)

## 2022-08-13 LAB — LIPID PANEL
Cholesterol: 150 mg/dL (ref 0–200)
HDL: 49.8 mg/dL (ref >39.00)
LDL Cholesterol: 88 mg/dL (ref 0–99)
NonHDL: 100.31
Total CHOL/HDL Ratio: 3
Triglycerides: 62 mg/dL (ref 0.0–149.0)
VLDL: 12.4 mg/dL (ref 0.0–40.0)

## 2022-08-13 NOTE — Patient Instructions (Signed)
Please return in 12 months for your annual complete physical; please come fasting.   I will release your lab results to you on your MyChart account with further instructions. You may see the results before I do, but when I review them I will send you a message with my report or have my assistant call you if things need to be discussed. Please reply to my message with any questions. Thank you!   Please schedule your colonoscopy and see if you can get your old records.  I'm glad you are doing so well!  If you have any questions or concerns, please don't hesitate to send me a message via MyChart or call the office at [phone]. Thank you for visiting with [place] today! It's our pleasure caring for you.   Please do these things to maintain good health!  Exercise at least 30-45 minutes a day,  4-5 days a week.  Eat a low-fat diet with lots of fruits and vegetables, up to 7-9 servings per day. Drink plenty of water daily. Try to drink 8 8oz glasses per day. Seatbelts can save your life. Always wear your seatbelt. Place Smoke Detectors on every level of your home and check batteries every year. Eye Doctor - have an eye exam every 1-2 years Safe sex - use condoms to protect yourself from STDs if you could be exposed to these types of infections. Avoid heavy alcohol use. If you drink, keep it to less than 2 drinks/day and not every day. [place].  Choose someone you trust that could speak for you if you became unable to speak for yourself. Depression is common in our stressful world.If you're feeling down or losing interest in things you normally enjoy, please come in for a visit.

## 2022-08-13 NOTE — Progress Notes (Signed)
\ Subjective  Chief Complaint  Patient presents with   Annual Exam    Pt here for Annual Exam and is currently fasting. Pt will call to set up colonoscopy    HPI: Paul Foster is a 76 y.o. male who presents to [facility] today for a Male Wellness Visit. He also has the concerns and/or needs as listed above in the chief complaint. These will be addressed in addition to the Health Maintenance Visit.   Wellness Visit: annual visit with health maintenance review and exam   HM: due colonoscopy; last was done in [place] and he reports having had 3 polyps removed and told to repeat in 5 years but I don't have records. He will try to find them. No symptoms. Thriving. No concerns. See AWV I reviewed it.   Body mass index is 26.36 kg/m. Wt Readings from Last 3 Encounters:  08/13/22 199 lb 12.8 oz (90.6 kg)  07/21/22 199 lb (90.3 kg)  09/12/21 206 lb 8 oz (93.7 kg)     Chronic disease management visit and/or acute problem visit: HDL: on lovastatin 10; eats well. Ideal weight. Keeps very active.  Premature FH CAD: no cp.  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 Neg brain MRI, CT and EEG. No further events/episodes. Nl mood and memory and cognitive screens Allergies controlled on meds H/o prostate CA: annual f/u with urology is stable.   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   Mixed hyperlipidemia 11/22/2019   Family history of premature CAD 11/22/2019   Polyp of colon 11/22/2019   Chronic allergic rhinitis 11/22/2019   Prostate cancer ([place]) 11/22/2019   Health Maintenance  Topic Date Due   COLONOSCOPY (Pts 45-37yr Insurance coverage will need to be confirmed)  04/06/2022   INFLUENZA VACCINE  07/15/2022   COVID-19 Vaccine (4 - Pfizer risk series) 08/29/2022 (Originally 11/13/2020)   TETANUS/TDAP  01/06/2029   Pneumonia Vaccine 76 Years old  Completed   Zoster Vaccines- Shingrix   Completed   HPV VACCINES  Aged Out   Hepatitis C Screening  Discontinued   Immunization History  Administered Date(s) Administered   Hepatitis B 03/09/1992   Influenza-Unspecified 10/07/2018, 08/30/2019, 09/17/2020   PFIZER(Purple Top)SARS-COV-2 Vaccination 01/20/2020, 02/14/2020, 09/18/2020   Pneumococcal Conjugate-13 10/28/2016   Pneumococcal Polysaccharide-23 10/07/2018   Tdap 01/06/2019   Zoster Recombinat (Shingrix) 10/07/2018, 01/06/2019   We updated and reviewed the patient's past history in detail and it is documented below. Allergies: Patient has No Known Allergies. Past Medical History  has a past medical history of Allergy, Hyperlipidemia, and TGA (transient global amnesia). Past Surgical History Patient  has a past surgical history that includes Tonsillectomy; Appendectomy; and hemorrhoidectomy. Social History Patient  reports that he has quit smoking. He has never used smokeless tobacco. He reports current alcohol use. He reports that he does not use drugs. Family History family history includes Heart attack in his brother, brother, father, and mother. Review of Systems: Constitutional: negative for fever or malaise Ophthalmic: negative for photophobia, double vision or loss of vision Cardiovascular: negative for chest pain, dyspnea on exertion, or new LE swelling Respiratory: negative for SOB or persistent cough Gastrointestinal: negative for abdominal pain, change in bowel habits or melena Genitourinary: negative for dysuria or gross hematuria Musculoskeletal: negative for new gait disturbance or muscular weakness Integumentary: negative for new or persistent rashes Neurological: negative for TIA or stroke symptoms Psychiatric: negative for SI  or delusions Allergic/Immunologic: negative for hives  Patient Care Team    Relationship Specialty Notifications Start End  Leamon Arnt, MD PCP - General Family Medicine  11/22/19   Alric Ran, MD Consulting  Physician Neurology  08/13/22    Objective  Vitals: BP 128/80   Pulse 69   Temp 98.8 F (37.1 C)   Ht '6\' 1"'$  (1.854 m)   Wt 199 lb 12.8 oz (90.6 kg)   SpO2 98%   BMI 26.36 kg/m  General:  Well developed, well nourished, no acute distress  Psych:  Alert and orientedx3,normal mood and affect HEENT:  Normocephalic, atraumatic, non-icteric sclera, PERRL, oropharynx is clear without mass or exudate, supple neck without adenopathy, mass or thyromegaly Cardiovascular:  Normal S1, S2, RRR without gallop, rub or murmur, nondisplaced PMI, +2 distal pulses in bilateral upper and lower extremities. Respiratory:  Good breath sounds bilaterally, CTAB with normal respiratory effort Gastrointestinal: normal bowel sounds, soft, non-tender, no noted masses. No HSM MSK: no deformities, contusions. Joints are without erythema or swelling. Spine and CVA region are nontender Skin:  Warm, no rashes or suspicious lesions noted Neurologic:    Mental status is normal. Gross motor and sensory exams are normal. Stable gait. No tremor    Assessment  1. Mixed hyperlipidemia   2. Prostate cancer ([place])   3. Transient global amnesia   4. Family history of premature CAD   5. Chronic allergic rhinitis   6. Polyp of colon, unspecified part of colon, unspecified type      Plan  Male Wellness Visit: Age appropriate Health Maintenance and Prevention measures were discussed with patient. Included topics are cancer screening recommendations, ways to keep healthy (see AVS) including dietary and exercise recommendations, regular eye and dental care, use of seat belts, and avoidance of moderate alcohol use and tobacco use. Pt to get GI records and get scheduled with [facility]. Referral already placed.  BMI: discussed patient's BMI and encouraged positive lifestyle modifications to help get to or maintain a target BMI. HM needs and immunizations were addressed and ordered. See below for orders. See HM and immunization section  for updates. Routine labs and screening tests ordered including cmp, cbc and lipids where appropriate. Discussed recommendations regarding Vit D and calcium supplementation (see AVS)  Chronic disease f/u and/or acute problem visit: (deemed necessary to be done in addition to the wellness visit): HLD: recheck fasting lipids on statin and lfts. Has been well controlled.  No further neuro sxs.  Continue allergy meds and supplements. Annual f/u with urology. Reports stable psa numbers.   Follow up: Return in about 1 year (around 08/14/2023) for complete physical.  Commons side effects, risks, benefits, and alternatives for medications and treatment plan prescribed today were discussed, and the patient expressed understanding of the given instructions. Patient is instructed to call or message via MyChart if he/she has any questions or concerns regarding our treatment plan. No barriers to understanding were identified. We discussed Red Flag symptoms and signs in detail. Patient expressed understanding regarding what to do in case of urgent or emergency type symptoms.  Medication list was reconciled, printed and provided to the patient in AVS. Patient instructions and summary information was reviewed with the patient as documented in the AVS. This note was prepared with assistance of Dragon voice recognition software. Occasional wrong-word or sound-a-like substitutions may have occurred due to the inherent limitations of voice recognition software  This visit occurred during the SARS-CoV-2 public health emergency.  Safety protocols were  in place, including screening questions prior to the visit, additional usage of staff PPE, and extensive cleaning of exam room while observing appropriate contact time as indicated for disinfecting solutions.   Orders Placed This Encounter  Procedures   CBC with Differential/Platelet   Comprehensive metabolic panel   Lipid panel   No orders of the defined types were  placed in this encounter.

## 2022-08-22 ENCOUNTER — Telehealth: Payer: Self-pay | Admitting: Family Medicine

## 2022-08-22 NOTE — Telephone Encounter (Signed)
Patient states: -He preferred for preventative colonoscopy to be completed by Dr. Juanita Craver instead of a provider a Santina Evans  - He filled out records of release form for previous practice in [place] to release records to [place]  Patient requests: -Confirmation on if records have been received or not - Patient can be reach at [phone].

## 2022-08-22 NOTE — Telephone Encounter (Signed)
Paul Foster will change the referral to Dr. Collene Mares

## 2022-09-08 ENCOUNTER — Encounter: Payer: Self-pay | Admitting: *Deleted

## 2022-09-09 ENCOUNTER — Encounter: Payer: Self-pay | Admitting: Family Medicine

## 2022-09-09 ENCOUNTER — Telehealth (INDEPENDENT_AMBULATORY_CARE_PROVIDER_SITE_OTHER): Payer: Medicare Other | Admitting: Family Medicine

## 2022-09-09 VITALS — BP 135/88 | HR 65 | Temp 98.6°F | Ht 73.0 in | Wt 200.0 lb

## 2022-09-09 DIAGNOSIS — U071 COVID-19: Secondary | ICD-10-CM

## 2022-09-09 MED ORDER — BENZONATATE 100 MG PO CAPS: ORAL_CAPSULE | ORAL | 0 refills | Status: DC

## 2022-09-09 NOTE — Patient Instructions (Signed)
  HOME CARE TIPS:   -I sent the medication(s) we discussed to your pharmacy: Meds ordered this encounter  Medications   benzonatate (TESSALON PERLES) 100 MG capsule    Sig: 1-2 capsules up to twice daily as needed for cough    Dispense:  30 capsule    Refill:  0    -there is a chance of rebound illness with covid after improving. This can happen whether or not you take an antiviral treatment. If you become sick again with covid after getting better, please schedule a follow up virtual visit and isolate again.  -nasal saline sinus rinses twice daily  -stay hydrated, drink plenty of fluids and eat small healthy meals - avoid dairy  -can take 1000 IU (60mg) Vit D3 and 100-500 mg of Vit C daily per instructions  -follow up with your doctor in 2-3 days unless improving and feeling better  -stay home while sick, except to seek medical care. If you have COVID19, you will likely be contagious for 7-10 days. Flu or Influenza is likely contagious for about 7 days. Other respiratory viral infections remain contagious for 5-10+ days depending on the virus and many other factors. Wear a good mask that fits snugly (such as N95 or KN95) if around others to reduce the risk of transmission.  It was nice to meet you today, and I really hope you are feeling better soon. I help [facility] out with telemedicine visits on Tuesdays and Thursdays and am happy to help if you need a follow up virtual visit on those days. Otherwise, if you have any concerns or questions following this visit please schedule a follow up visit with your Primary Care doctor or seek care at a local urgent care clinic to avoid delays in care.    Seek in person care or schedule a follow up video visit promptly if your symptoms worsen, new concerns arise or you are not improving with treatment. Call 911 and/or seek emergency care if your symptoms are severe or life threatening.

## 2022-09-09 NOTE — Progress Notes (Signed)
Virtual Visit via Video Note  I connected with Shawntez  on 09/09/22 at 10:40 AM EDT by a video enabled telemedicine application and verified that I am speaking with the correct person using two identifiers.  Location patient: [place] Location provider:work or home office Persons participating in the virtual visit: patient, provider  I discussed the limitations and requested verbal permission for telemedicine visit. The patient expressed understanding and agreed to proceed.   HPI:  Acute telemedicine visit for Covid19: -Onset: 1 week ago -[facility] for "a sinus infection", then another family member got sick and they tested positive covid so he tested and it was positive -Symptoms include: nasal congestion, headache, cough, sneezing - headache is better now and feels much better but still has sneeze and cough -Denies:CP, SOB, NVD, inability to tol oral intake or get out of bed -Has tried:abx per above -Pertinent past medical history: see below, has not had covid before -Pertinent medication allergies:No Known Allergies -COVID-19 vaccine status:  Immunization History  Administered Date(s) Administered   Hepatitis B 03/09/1992   Influenza-Unspecified 10/07/2018, 08/30/2019, 09/17/2020   PFIZER(Purple Top)SARS-COV-2 Vaccination 01/20/2020, 02/14/2020, 09/18/2020   Pneumococcal Conjugate-13 10/28/2016   Pneumococcal Polysaccharide-23 10/07/2018   Tdap 01/06/2019   Zoster Recombinat (Shingrix) 10/07/2018, 01/06/2019     ROS: See pertinent positives and negatives per HPI.  Past Medical History:  Diagnosis Date   Allergy    Hyperlipidemia    TGA (transient global amnesia)     Past Surgical History:  Procedure Laterality Date   APPENDECTOMY     hemorrhoidectomy     TONSILLECTOMY       Current Outpatient Medications:    Ascorbic Acid (VITAMIN C) 1000 MG tablet, Take 1,000 mg by mouth daily., Disp: , Rfl:    aspirin 81 MG EC tablet, Take 81 mg by  mouth daily., Disp: , Rfl:    benzonatate (TESSALON PERLES) 100 MG capsule, 1-2 capsules up to twice daily as needed for cough, Disp: 30 capsule, Rfl: 0   Calcium Carbonate (CALCIUM 600 PO), Take 1 capsule by mouth daily., Disp: , Rfl:    Cholecalciferol (VITAMIN D3 MAXIMUM STRENGTH) 125 MCG (5000 UT) capsule, Take 5,000 Units by mouth daily., Disp: , Rfl:    docusate sodium (COLACE) 100 MG capsule, Take 200 mg by mouth daily., Disp: , Rfl:    Flavoring Agent (BLUEBERRY FLAVOR) LIQD, by Does not apply route. Bluberry extract 600 mg, Disp: , Rfl:    fluticasone (FLONASE) 50 MCG/ACT nasal spray, Place 1 spray into both nostrils daily as needed for allergies., Disp: , Rfl:    loratadine (CLARITIN) 10 MG tablet, Take 10 mg by mouth daily., Disp: , Rfl:    lovastatin (MEVACOR) 10 MG tablet, TAKE 1 TABLET AT BEDTIME, Disp: 90 tablet, Rfl: 3   Multiple Vitamin (MULTIVITAMIN) tablet, Take 1 tablet by mouth daily., Disp: , Rfl:    Omega 3-6-9 Fatty Acids (OMEGA-3 & OMEGA-6 FISH OIL PO), Take 1 capsule by mouth daily., Disp: , Rfl:    Wheat Dextrin (BENEFIBER PO), Take by mouth., Disp: , Rfl:   EXAM:  VITALS per patient if applicable:denies fever  GENERAL: alert, oriented, appears well and in no acute distress  HEENT: atraumatic, conjunttiva clear, no obvious abnormalities on inspection of external nose and ears  NECK: normal movements of the head and neck  LUNGS: on inspection no signs of respiratory distress, breathing rate appears normal, no obvious gross SOB, gasping or wheezing, occ cough  CV: no obvious cyanosis  MS: moves all visible extremities without noticeable abnormality  PSYCH/NEURO: pleasant and cooperative, no obvious depression or anxiety, speech and thought processing grossly intact  ASSESSMENT AND PLAN:  Discussed the following assessment and plan:  COVID-19  -we discussed possible serious and likely etiologies, options for evaluation and workup, limitations of  telemedicine visit vs in person visit, treatment, treatment risks and precautions. Pt is agreeable to treatment via telemedicine at this moment. Discussed tx window, typical and atypical courses for covid infection. He is out of the tx window and is improving. He opted for Tessalon rx for cough and nasal saline rinses. Discussed typical contagious period and recs per cdc home for 5 days, then masking for 5 additional days and until asymptomatic.  Advised to seek prompt virtual visit or in person care if worsening, new symptoms arise, or if is not improving with treatment as expected per our conversation of expected course. Discussed options for follow up care. Did let this patient know that I do telemedicine on Tuesdays and Thursdays for [facility] and those are the days I am logged into the system. Advised to schedule follow up visit with PCP, [facility] virtual visits or [facility] if any further questions or concerns to avoid delays in care.   I discussed the assessment and treatment plan with the patient. The patient was provided an opportunity to ask questions and all were answered. The patient agreed with the plan and demonstrated an understanding of the instructions.     Lucretia Kern, DO

## 2022-09-11 ENCOUNTER — Other Ambulatory Visit: Payer: Self-pay | Admitting: Family Medicine

## 2022-09-15 ENCOUNTER — Encounter: Payer: Self-pay | Admitting: Family Medicine

## 2022-09-15 ENCOUNTER — Ambulatory Visit (INDEPENDENT_AMBULATORY_CARE_PROVIDER_SITE_OTHER): Payer: Medicare Other | Admitting: Family Medicine

## 2022-09-15 VITALS — BP 116/70 | HR 108 | Temp 98.6°F | Ht 73.0 in | Wt 200.4 lb

## 2022-09-15 DIAGNOSIS — K4090 Unilateral inguinal hernia, without obstruction or gangrene, not specified as recurrent: Secondary | ICD-10-CM | POA: Diagnosis not present

## 2022-09-15 MED ORDER — FLUTICASONE PROPIONATE 50 MCG/ACT NA SUSP: 1.0000 | Freq: Every day | NASAL | 3 refills | Status: DC | PRN

## 2022-09-15 NOTE — Patient Instructions (Signed)
Please follow up if symptoms do not improve or as needed.    I have placed a referral to general surgery to discuss hernia repair.  Please call Dr. Lorie Apley office to follow up on the referral. We have faxed over your paperwork. Let me know if they need more from our office.  Dr. Juanita Craver Address: [address] [place] [address] Phone: [phone]  Inguinal Hernia, Adult An inguinal hernia develops when fat or the intestines push through a weak spot in a muscle where the leg meets the lower abdomen (groin). This creates a bulge. This kind of hernia could also be: In the scrotum, if you are male. In folds of skin around the vagina, if you are male. There are three types of inguinal hernias: Hernias that can be pushed back into the abdomen (are reducible). This type rarely causes pain. Hernias that are not reducible (are incarcerated). Hernias that are not reducible and lose their blood supply (are strangulated). This type of hernia requires emergency surgery. What are the causes? This condition is caused by having a weak spot in the muscles or tissues in your groin. This develops over time. The hernia may poke through the weak spot when you suddenly strain your lower abdominal muscles, such as when you: Lift a heavy object. Strain to have a bowel movement. Constipation can lead to straining. Cough. What increases the risk? This condition is more likely to develop in: Males. Pregnant females. People who: Are overweight. Work in jobs that require long periods of standing or heavy lifting. Have had an inguinal hernia before. Smoke or have lung disease. These factors can lead to long-term (chronic) coughing. What are the signs or symptoms? Symptoms may depend on the size of the hernia. Often, a small inguinal hernia has no symptoms. Symptoms of a larger hernia may include: A bulge in the groin area. This is easier to see when standing. It might not be visible when  lying down. Pain or burning in the groin. This may get worse when lifting, straining, or coughing. A dull ache or a feeling of pressure in the groin. An unusual bulge in the scrotum, in males. Symptoms of a strangulated inguinal hernia may include: A bulge in your groin that is very painful and tender to the touch. A bulge that turns red or purple. Fever, nausea, and vomiting. Inability to have a bowel movement or to pass gas. How is this diagnosed? This condition is diagnosed based on your symptoms, your medical history, and a physical exam. Your health care provider may feel your groin area and ask you to cough. How is this treated? Treatment depends on the size of your hernia and whether you have symptoms. If you do not have symptoms, your health care provider may have you watch your hernia carefully and have you come in for follow-up visits. If your hernia is large or if you have symptoms, you may need surgery to repair the hernia. Follow these instructions at home: Lifestyle Avoid lifting heavy objects. Avoid standing for long periods of time. Do not use any products that contain nicotine or tobacco. These products include cigarettes, chewing tobacco, and vaping devices, such as e-cigarettes. If you need help quitting, ask your health care provider. Maintain a healthy weight. Preventing constipation You may need to take these actions to prevent or treat constipation: Drink enough fluid to keep your urine pale yellow. Take over-the-counter or prescription medicines. Eat foods that are high in fiber, such as  beans, whole grains, and fresh fruits and vegetables. Limit foods that are high in fat and processed sugars, such as fried or sweet foods. General instructions You may try to push the hernia back in place by very gently pressing on it while lying down. Do not try to force the bulge back in if it will not push in easily. Watch your hernia for any changes in shape, size, or color. Get  help right away if you notice any changes. Take over-the-counter and prescription medicines only as told by your health care provider. Keep all follow-up visits. This is important. Contact a health care provider if: You have a fever or chills. You develop new symptoms. Your symptoms get worse. Get help right away if: You have pain in your groin that suddenly gets worse. You have a bulge in your groin that: Suddenly gets bigger and does not get smaller. Becomes red or purple or painful to the touch. You are a man and you have a sudden pain in your scrotum, or the size of your scrotum suddenly changes. You cannot push the hernia back in place by very gently pressing on it when you are lying down. You have nausea or vomiting that does not go away. You have a fast heartbeat. You cannot have a bowel movement or pass gas. These symptoms may represent a serious problem that is an emergency. Do not wait to see if the symptoms will go away. Get medical help right away. Call your local emergency services (911 in the [place] Summary An inguinal hernia develops when fat or the intestines push through a weak spot in a muscle where your leg meets your lower abdomen (groin). This condition is caused by having a weak spot in muscles or tissues in your groin. Symptoms may depend on the size of the hernia, and they may include pain or swelling in your groin. A small inguinal hernia often has no symptoms. Treatment may not be needed if you do not have symptoms. If you have symptoms or a large hernia, you may need surgery to repair the hernia. Avoid lifting heavy objects. Also, avoid standing for long periods of time. This information is not intended to replace advice given to you by your health care provider. Make sure you discuss any questions you have with your health care provider. Document Revised: 07/31/2020 Document Reviewed: 07/31/2020 Elsevier Patient Education  [place].

## 2022-09-15 NOTE — Progress Notes (Signed)
Subjective  CC:  Chief Complaint  Patient presents with   Groin Pain    Pt stated that he has had some groin pain for the past 5 weeks. He was unloading 5 tons of mulch    HPI: Paul Foster is a 76 y.o. male who presents to the office today to address the problems listed above in the chief complaint. Paul Foster reports that he was now has a bulge in his left groin with a dull ache to the scrotum.  At times he has a bit more intense pain.  The bulge has been noticeable twice.  Both times it reduced spontaneously.  Does not have moderate or severe pain.  He has not taken medication.  No abdominal pain.  This is a new problem.  Assessment  1. Left inguinal hernia      Plan  Left inguinal hernia: Easily reducible.  Refer to general surgery.  Handout given.  Tylenol or Advil as needed.  To emergency room for severe pain.  Discussed risks of incarceration. Follow-up on colon cancer referral for screening.  Dr. Lorie Apley office has not yet reached out to him.  Recommend he call their office.  Follow up: As needed Visit date not found  Orders Placed This Encounter  Procedures   Ambulatory referral to General Surgery   Meds ordered this encounter  Medications   fluticasone (FLONASE) 50 MCG/ACT nasal spray    Sig: Place 1 spray into both nostrils daily as needed for allergies.    Dispense:  16 g    Refill:  3      I reviewed the patients updated PMH, FH, and SocHx.    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    Allergies: Patient has No Known Allergies. Family History: Patient family history includes Heart attack in his brother,  brother, father, and mother. Social History:  Patient  reports that he has quit smoking. He has never used smokeless tobacco. He reports current alcohol use. He reports that he does not use drugs.  Review of Systems: Constitutional: Negative for fever malaise or anorexia Cardiovascular: negative for chest pain Respiratory: negative for SOB or persistent cough Gastrointestinal: negative for abdominal pain  Objective  Vitals: BP 116/70   Pulse (!) 108   Temp 98.6 F (37 C)   Ht '6\' 1"'$  (1.854 m)   Wt 200 lb 6.4 oz (90.9 kg)   SpO2 96%   BMI 26.44 kg/m  General: no acute distress , A&Ox3 Appears well Left lower abdomen with visible bulge palpable, minimally tender Left inguinal hernia is palpated in the scrotum       Commons side effects, risks, benefits, and alternatives for medications and treatment plan prescribed today were discussed, and the patient expressed understanding of the given instructions. Patient is instructed to call or message via MyChart if he/she has any questions or concerns regarding our treatment plan. No barriers to understanding were identified. We discussed Red Flag symptoms and signs in detail. Patient expressed understanding regarding what to do in case of urgent or emergency type symptoms.  Medication list was reconciled, printed and provided to the patient in  AVS. Patient instructions and summary information was reviewed with the patient as documented in the AVS. This note was prepared with assistance of Dragon voice recognition software. Occasional wrong-word or sound-a-like substitutions may have occurred due to the inherent limitations of voice recognition software  This visit occurred during the SARS-CoV-2 public health emergency.  Safety protocols were in place, including screening questions prior to the visit, additional usage of staff PPE, and extensive cleaning of exam room while observing appropriate contact time as indicated for disinfecting  solutions.

## 2022-09-22 ENCOUNTER — Telehealth: Payer: Self-pay | Admitting: Family Medicine

## 2022-09-22 NOTE — Telephone Encounter (Signed)
Patient states each RX for fluticasone (FLONASE) 50 MCG/ACT nasal spray  should have 3 vials per shipment. Patient state he only received 1 vial.  Requests RX for the above with 3 vials be sent to:   [facility], [place] Phone: [phone]  Fax: [phone]

## 2022-09-23 ENCOUNTER — Other Ambulatory Visit: Payer: Self-pay

## 2022-09-23 DIAGNOSIS — Z1211 Encounter for screening for malignant neoplasm of colon: Secondary | ICD-10-CM

## 2022-09-23 NOTE — Telephone Encounter (Signed)
Message sent thru MyChart 

## 2022-09-24 MED ORDER — FLUTICASONE PROPIONATE 50 MCG/ACT NA SUSP: 1.0000 | Freq: Every day | NASAL | 3 refills | Status: DC | PRN

## 2022-10-08 ENCOUNTER — Ambulatory Visit (INDEPENDENT_AMBULATORY_CARE_PROVIDER_SITE_OTHER): Payer: Medicare Other

## 2022-10-08 ENCOUNTER — Ambulatory Visit (INDEPENDENT_AMBULATORY_CARE_PROVIDER_SITE_OTHER): Payer: Medicare Other | Admitting: Sports Medicine

## 2022-10-08 VITALS — BP 122/84 | Ht 73.0 in | Wt 200.0 lb

## 2022-10-08 DIAGNOSIS — M25561 Pain in right knee: Secondary | ICD-10-CM | POA: Diagnosis not present

## 2022-10-08 MED ORDER — MELOXICAM 15 MG PO TABS: 15.0000 mg | ORAL_TABLET | Freq: Every day | ORAL | 0 refills | Status: DC

## 2022-10-08 NOTE — Patient Instructions (Addendum)
Good to see you  - Start meloxicam 15 mg daily x2 weeks.  If still having pain after 2 weeks, complete 3rd-week of meloxicam. May use remaining meloxicam as needed once daily for pain control.  Do not to use additional NSAIDs while taking meloxicam.  May use Tylenol [phone] mg 2 to 3 times a day for breakthrough pain. Relative rest dont kneel on to right knee As needed follow up if no improvement to symptoms follow up 3-4 weeks

## 2022-10-08 NOTE — Progress Notes (Signed)
Paul Foster D.[place] [facility] Phone: [phone]   Assessment and Plan:     1. Acute pain of right knee -Acute, complicated, initial sports medicine visit - Consistent with fracture of lateral patellar osteophyte based on HPI, physical exam, x-ray - X-ray obtained in clinic.  My interpretation: Large lateral patellar osteophyte with possible acute fracture.  This area correlates with TTP and the spot on patient's knee where he was pushing machinery.  Mildly decreased joint space in medial and lateral compartments with cortical changes.  Significantly decreased joint space and patellofemoral compartment..  Cortical density in distal femoral shaft. - Patient has minimal pain with ambulation and no pain with resisted flexion extension of knee, so no bracing or weightbearing restrictions given - I recommend relative rest and no kneeling onto right knee.  Patient has hernia surgery planned for 10/17/2022 and will have to rest following surgery - Start meloxicam 15 mg daily x2 weeks.  If still having pain after 2 weeks, complete 3rd-week of meloxicam. May use remaining meloxicam as needed once daily for pain control.  Do not to use additional NSAIDs while taking meloxicam.  May use Tylenol [phone] mg 2 to 3 times a day for breakthrough pain.  - DG Knee AP/LAT W/Sunrise Right; Future  Other orders - meloxicam (MOBIC) 15 MG tablet; Take 1 tablet (15 mg total) by mouth daily.    Pertinent previous records reviewed include none   Follow Up: Typically with follow-up in 3 to 4 weeks, however with patient's hernia surgery planned for 10/17/2022, we will leave follow-up as needed.  Could consider bracing versus CSI if no improvement or worsening of symptoms.   Subjective:   I, Paul Foster, am serving as a Education administrator for Paul Foster  Chief Complaint: right knee pain   HPI:   10/08/22 Patient is a 76 year old male  complaining of right knee pain . Patient states that he was moving equipment yesterday and he tried to use it as leverage and he felt a pop an immediate pain , TTP, he has pain walking , states he didn't lock up , no numbness or tingling , no radiating pain , pain is all underneath or inside the knee cap, no meds for the pain this time   Relevant Historical Information: History of prostate cancer  Additional pertinent review of systems negative.   Current Outpatient Medications:    meloxicam (MOBIC) 15 MG tablet, Take 1 tablet (15 mg total) by mouth daily., Disp: 30 tablet, Rfl: 0   Ascorbic Acid (VITAMIN C) 1000 MG tablet, Take 1,000 mg by mouth daily., Disp: , Rfl:    aspirin 81 MG EC tablet, Take 81 mg by mouth daily., Disp: , Rfl:    benzonatate (TESSALON PERLES) 100 MG capsule, 1-2 capsules up to twice daily as needed for cough, Disp: 30 capsule, Rfl: 0   Calcium Carbonate (CALCIUM 600 PO), Take 1 capsule by mouth daily., Disp: , Rfl:    Cholecalciferol (VITAMIN D3 MAXIMUM STRENGTH) 125 MCG (5000 UT) capsule, Take 5,000 Units by mouth daily., Disp: , Rfl:    docusate sodium (COLACE) 100 MG capsule, Take 200 mg by mouth daily., Disp: , Rfl:    Flavoring Agent (BLUEBERRY FLAVOR) LIQD, by Does not apply route. Bluberry extract 600 mg, Disp: , Rfl:    fluticasone (FLONASE) 50 MCG/ACT nasal spray, Place 1 spray into both nostrils daily as needed for allergies., Disp: 48 g, Rfl:  3   loratadine (CLARITIN) 10 MG tablet, Take 10 mg by mouth daily., Disp: , Rfl:    lovastatin (MEVACOR) 10 MG tablet, TAKE 1 TABLET AT BEDTIME, Disp: 90 tablet, Rfl: 3   Multiple Vitamin (MULTIVITAMIN) tablet, Take 1 tablet by mouth daily., Disp: , Rfl:    Omega 3-6-9 Fatty Acids (OMEGA-3 & OMEGA-6 FISH OIL PO), Take 1 capsule by mouth daily., Disp: , Rfl:    Wheat Dextrin (BENEFIBER PO), Take by mouth., Disp: , Rfl:    Objective:     Vitals:   10/08/22 0947  BP: 122/84  Weight: 200 lb (90.7 kg)  Height: '6\' 1"'$   (1.854 m)      Body mass index is 26.39 kg/m.    Physical Exam:    General:  awake, alert oriented, no acute distress nontoxic Skin: no suspicious lesions or rashes Neuro:sensation intact and strength 5/5 with no deficits, no atrophy, normal muscle tone Psych: No signs of anxiety, depression or other mood disorder  Right knee: No swelling No deformity Neg fluid wave, joint milking ROM Flex 100, Ext 5 TTP lateral patella, lateral femoral condyle NTTP over the quad tendon, medial fem condyle, , patella, plica, patella tendon, tibial tuberostiy, fibular head, posterior fossa, pes anserine bursa, gerdy's tubercle, medial jt line, lateral jt line Neg anterior and posterior drawer Neg lachman Neg sag sign Negative varus stress Negative valgus stress Negative McMurray    Gait normal    Electronically signed by:  Paul Foster D.[facility] 10:05 AM 10/08/22

## 2022-10-14 ENCOUNTER — Ambulatory Visit: Payer: Self-pay | Admitting: Surgery

## 2022-10-14 ENCOUNTER — Encounter (HOSPITAL_BASED_OUTPATIENT_CLINIC_OR_DEPARTMENT_OTHER): Payer: Self-pay | Admitting: Surgery

## 2022-10-14 NOTE — Progress Notes (Signed)
Spoke w/ via phone for pre-op interview--- pt Lab needs dos----   no (per anes)/  pre-op orders pending        Lab results------  no COVID test -----patient states asymptomatic no test needed Arrive at ------- 0700 on 10-17-2022 NPO after MN NO Solid Food.  Clear liquids from MN until--- 0600 Med rec completed Medications to take morning of surgery ----- colace, claritin  Diabetic medication ----- n/a Patient instructed no nail polish to be worn day of surgery Patient instructed to bring photo id and insurance card day of surgery Patient aware to have Driver (ride ) / caregiver for 24 hours after surgery --- niece, renee Patient Special Instructions -----  pt stated was given instructions about asa , however, pt only takes due to [place] CAD, stated he will stop today and had already stopped mobic Pre-Op special Istructions ----- sent in box message in epic to dr Foy [place], requested orders Patient verbalized understanding of instructions that were given at this phone interview. Patient denies shortness of breath, chest pain, fever, cough at this phone interview.   Anesthesia review:  ED visit 08/ 2022 with syncope/ transient global amnesia;  pt had neurology evaluation by dr c. Camara (lov 09-12-2021)  normal EEG, echo, and MRI, event monitor.  Pt stated never had issue prior to this episode and none since.

## 2022-10-16 NOTE — Anesthesia Preprocedure Evaluation (Addendum)
Anesthesia Evaluation  Patient identified by MRN, date of birth, ID band Patient awake    Reviewed: Allergy & Precautions, NPO status , Patient's Chart, lab work & pertinent test results  Airway Mallampati: II  TM Distance: >3 FB Neck ROM: Full    Dental no notable dental hx. (+) Missing,    Pulmonary former smoker   Pulmonary exam normal breath sounds clear to auscultation       Cardiovascular Exercise Tolerance: Good Normal cardiovascular exam Rhythm:Regular Rate:Normal     Neuro/Psych negative neurological ROS  negative psych ROS   GI/Hepatic negative GI ROS,,,  Endo/Other    Renal/GU      Musculoskeletal negative musculoskeletal ROS (+)    Abdominal   Peds  Hematology   Anesthesia Other Findings   Reproductive/Obstetrics                             Anesthesia Physical Anesthesia Plan  ASA: 2  Anesthesia Plan: General   Post-op Pain Management:    Induction: Intravenous  PONV Risk Score and Plan: Treatment may vary due to age or medical condition and Ondansetron  Airway Management Planned: Oral ETT  Additional Equipment: None  Intra-op Plan:   Post-operative Plan: Extubation in OR  Informed Consent: I have reviewed the patients History and Physical, chart, labs and discussed the procedure including the risks, benefits and alternatives for the proposed anesthesia with the patient or authorized representative who has indicated his/her understanding and acceptance.     Dental advisory given  Plan Discussed with: CRNA and Surgeon  Anesthesia Plan Comments:         Anesthesia Quick Evaluation

## 2022-10-17 ENCOUNTER — Encounter (HOSPITAL_BASED_OUTPATIENT_CLINIC_OR_DEPARTMENT_OTHER): Payer: Self-pay | Admitting: Surgery

## 2022-10-17 ENCOUNTER — Ambulatory Visit (HOSPITAL_BASED_OUTPATIENT_CLINIC_OR_DEPARTMENT_OTHER): Payer: Medicare Other | Admitting: Anesthesiology

## 2022-10-17 ENCOUNTER — Ambulatory Visit (HOSPITAL_BASED_OUTPATIENT_CLINIC_OR_DEPARTMENT_OTHER)
Admission: RE | Admit: 2022-10-17 | Discharge: 2022-10-17 | Disposition: A | Discharge status: Home / Self Care | Payer: Medicare Other | Source: Ambulatory Visit | Attending: Surgery | Admitting: Surgery

## 2022-10-17 ENCOUNTER — Other Ambulatory Visit: Payer: Self-pay

## 2022-10-17 ENCOUNTER — Encounter (HOSPITAL_BASED_OUTPATIENT_CLINIC_OR_DEPARTMENT_OTHER)
Admission: RE | Disposition: A | Discharge status: Home / Self Care | Payer: Self-pay | Source: Ambulatory Visit | Attending: Surgery

## 2022-10-17 DIAGNOSIS — Z01818 Encounter for other preprocedural examination: Secondary | ICD-10-CM

## 2022-10-17 DIAGNOSIS — K4020 Bilateral inguinal hernia, without obstruction or gangrene, not specified as recurrent: Secondary | ICD-10-CM | POA: Diagnosis present

## 2022-10-17 DIAGNOSIS — Z87891 Personal history of nicotine dependence: Secondary | ICD-10-CM | POA: Diagnosis not present

## 2022-10-17 HISTORY — DX: Benign prostatic hyperplasia with lower urinary tract symptoms: N40.1

## 2022-10-17 HISTORY — DX: Presence of spectacles and contact lenses: Z97.3

## 2022-10-17 HISTORY — DX: Male erectile dysfunction, unspecified: N52.9

## 2022-10-17 HISTORY — DX: Mixed hyperlipidemia: E78.2

## 2022-10-17 HISTORY — DX: Allergic rhinitis, unspecified: J30.9

## 2022-10-17 HISTORY — DX: Unilateral inguinal hernia, without obstruction or gangrene, not specified as recurrent: K40.90

## 2022-10-17 HISTORY — PX: INGUINAL HERNIA REPAIR: SHX194

## 2022-10-17 SURGERY — REPAIR, HERNIA, INGUINAL, LAPAROSCOPIC
Anesthesia: General | Site: Inguinal | Laterality: Bilateral

## 2022-10-17 MED ORDER — ACETAMINOPHEN 10 MG/ML IV SOLN: 1000.0000 mg | Freq: Once | INTRAVENOUS | Status: DC | PRN

## 2022-10-17 MED ORDER — FENTANYL CITRATE (PF) 100 MCG/2ML IJ SOLN
INTRAMUSCULAR | Status: AC
  Filled 2022-10-17: qty 2

## 2022-10-17 MED ORDER — BUPIVACAINE LIPOSOME 1.3 % IJ SUSP: 20.0000 mL | Freq: Once | INTRAMUSCULAR | Status: DC

## 2022-10-17 MED ORDER — FENTANYL CITRATE (PF) 100 MCG/2ML IJ SOLN: 25.0000 ug | INTRAMUSCULAR | Status: DC | PRN

## 2022-10-17 MED ORDER — LIDOCAINE 2% (20 MG/ML) 5 ML SYRINGE
INTRAMUSCULAR | Status: DC | PRN
  Administered 2022-10-17: 60 mg via INTRAVENOUS

## 2022-10-17 MED ORDER — LACTATED RINGERS IV SOLN: INTRAVENOUS | Status: DC

## 2022-10-17 MED ORDER — BUPIVACAINE HCL 0.5 % IJ SOLN
INTRAMUSCULAR | Status: DC | PRN
  Administered 2022-10-17: 30 mL

## 2022-10-17 MED ORDER — CHLORHEXIDINE GLUCONATE CLOTH 2 % EX PADS: 6.0000 | MEDICATED_PAD | Freq: Once | CUTANEOUS | Status: DC

## 2022-10-17 MED ORDER — ACETAMINOPHEN 500 MG PO TABS
1000.0000 mg | ORAL_TABLET | ORAL | Status: AC
  Administered 2022-10-17: 1000 mg via ORAL

## 2022-10-17 MED ORDER — 0.9 % SODIUM CHLORIDE (POUR BTL) OPTIME
TOPICAL | Status: DC | PRN
  Administered 2022-10-17: 500 mL

## 2022-10-17 MED ORDER — ROCURONIUM BROMIDE 10 MG/ML (PF) SYRINGE
PREFILLED_SYRINGE | INTRAVENOUS | Status: AC
  Filled 2022-10-17: qty 10

## 2022-10-17 MED ORDER — ONDANSETRON HCL 4 MG/2ML IJ SOLN
INTRAMUSCULAR | Status: AC
  Filled 2022-10-17: qty 2

## 2022-10-17 MED ORDER — BUPIVACAINE LIPOSOME 1.3 % IJ SUSP
INTRAMUSCULAR | Status: DC | PRN
  Administered 2022-10-17: 10 mL

## 2022-10-17 MED ORDER — LIDOCAINE HCL (PF) 2 % IJ SOLN
INTRAMUSCULAR | Status: AC
  Filled 2022-10-17: qty 5

## 2022-10-17 MED ORDER — GABAPENTIN 300 MG PO CAPS
ORAL_CAPSULE | ORAL | Status: AC
  Filled 2022-10-17: qty 1

## 2022-10-17 MED ORDER — ONDANSETRON HCL 4 MG/2ML IJ SOLN: 4.0000 mg | Freq: Once | INTRAMUSCULAR | Status: DC | PRN

## 2022-10-17 MED ORDER — PHENYLEPHRINE HCL-NACL 20-0.9 MG/250ML-% IV SOLN
INTRAVENOUS | Status: DC | PRN
  Administered 2022-10-17: 50 ug/min via INTRAVENOUS

## 2022-10-17 MED ORDER — EPHEDRINE SULFATE-NACL 50-0.9 MG/10ML-% IV SOSY
PREFILLED_SYRINGE | INTRAVENOUS | Status: DC | PRN
  Administered 2022-10-17 (×4): 10 mg via INTRAVENOUS

## 2022-10-17 MED ORDER — OXYCODONE-ACETAMINOPHEN 5-325 MG PO TABS: 1.0000 | ORAL_TABLET | ORAL | 0 refills | Status: DC | PRN

## 2022-10-17 MED ORDER — ONDANSETRON HCL 4 MG/2ML IJ SOLN
INTRAMUSCULAR | Status: DC | PRN
  Administered 2022-10-17: 4 mg via INTRAVENOUS

## 2022-10-17 MED ORDER — CEFAZOLIN SODIUM-DEXTROSE 2-4 GM/100ML-% IV SOLN
INTRAVENOUS | Status: AC
  Filled 2022-10-17: qty 100

## 2022-10-17 MED ORDER — DEXAMETHASONE SODIUM PHOSPHATE 10 MG/ML IJ SOLN
INTRAMUSCULAR | Status: AC
  Filled 2022-10-17: qty 1

## 2022-10-17 MED ORDER — SUGAMMADEX SODIUM 200 MG/2ML IV SOLN
INTRAVENOUS | Status: DC | PRN
  Administered 2022-10-17: 200 mg via INTRAVENOUS

## 2022-10-17 MED ORDER — DEXAMETHASONE SODIUM PHOSPHATE 10 MG/ML IJ SOLN
INTRAMUSCULAR | Status: DC | PRN
  Administered 2022-10-17: 5 mg via INTRAVENOUS

## 2022-10-17 MED ORDER — DEXMEDETOMIDINE HCL IN NACL 80 MCG/20ML IV SOLN
INTRAVENOUS | Status: DC | PRN
  Administered 2022-10-17 (×2): 4 ug via BUCCAL

## 2022-10-17 MED ORDER — FENTANYL CITRATE (PF) 100 MCG/2ML IJ SOLN
INTRAMUSCULAR | Status: DC | PRN
  Administered 2022-10-17: 25 ug via INTRAVENOUS
  Administered 2022-10-17 (×2): 50 ug via INTRAVENOUS
  Administered 2022-10-17: 25 ug via INTRAVENOUS

## 2022-10-17 MED ORDER — CEFAZOLIN SODIUM-DEXTROSE 2-4 GM/100ML-% IV SOLN
2.0000 g | INTRAVENOUS | Status: AC
  Administered 2022-10-17: 2 g via INTRAVENOUS

## 2022-10-17 MED ORDER — GABAPENTIN 300 MG PO CAPS
300.0000 mg | ORAL_CAPSULE | ORAL | Status: AC
  Administered 2022-10-17: 300 mg via ORAL

## 2022-10-17 MED ORDER — PROPOFOL 10 MG/ML IV BOLUS
INTRAVENOUS | Status: DC | PRN
  Administered 2022-10-17: 120 mg via INTRAVENOUS

## 2022-10-17 MED ORDER — ROCURONIUM BROMIDE 10 MG/ML (PF) SYRINGE
PREFILLED_SYRINGE | INTRAVENOUS | Status: DC | PRN
  Administered 2022-10-17: 60 mg via INTRAVENOUS
  Administered 2022-10-17: 10 mg via INTRAVENOUS

## 2022-10-17 MED ORDER — ACETAMINOPHEN 500 MG PO TABS
ORAL_TABLET | ORAL | Status: AC
  Filled 2022-10-17: qty 2

## 2022-10-17 MED ORDER — PHENYLEPHRINE 80 MCG/ML (10ML) SYRINGE FOR IV PUSH (FOR BLOOD PRESSURE SUPPORT)
PREFILLED_SYRINGE | INTRAVENOUS | Status: DC | PRN
  Administered 2022-10-17: 80 ug via INTRAVENOUS
  Administered 2022-10-17: 160 ug via INTRAVENOUS

## 2022-10-17 SURGICAL SUPPLY — 41 items
ADH SKN CLS APL DERMABOND .7 (GAUZE/BANDAGES/DRESSINGS) ×1
APL PRP STRL LF DISP 70% ISPRP (MISCELLANEOUS) ×1
BLADE CLIPPER SENSICLIP SURGIC (BLADE) IMPLANT
CABLE HIGH FREQUENCY MONO STRZ (ELECTRODE) ×1 IMPLANT
CHLORAPREP W/TINT 26 (MISCELLANEOUS) ×1 IMPLANT
DERMABOND ADVANCED .7 DNX12 (GAUZE/BANDAGES/DRESSINGS) ×1 IMPLANT
ELECT REM PT RETURN 9FT ADLT (ELECTROSURGICAL) ×1
ELECTRODE REM PT RTRN 9FT ADLT (ELECTROSURGICAL) ×1 IMPLANT
GAUZE 4X4 16PLY ~~LOC~~+RFID DBL (SPONGE) ×1 IMPLANT
GLOVE BIO SURGEON STRL SZ7.5 (GLOVE) ×1 IMPLANT
GLOVE BIOGEL PI IND STRL 6.5 (GLOVE) IMPLANT
GLOVE BIOGEL PI IND STRL 8 (GLOVE) ×1 IMPLANT
GLOVE SURG SS PI 7.0 STRL IVOR (GLOVE) IMPLANT
GLOVE SURG SS PI 7.5 STRL IVOR (GLOVE) IMPLANT
GOWN STRL REUS W/ TWL XL LVL3 (GOWN DISPOSABLE) ×1 IMPLANT
GOWN STRL REUS W/TWL LRG LVL3 (GOWN DISPOSABLE) IMPLANT
GOWN STRL REUS W/TWL XL LVL3 (GOWN DISPOSABLE) ×1 IMPLANT
GRASPER SUT TROCAR 14GX15 (MISCELLANEOUS) ×1 IMPLANT
KIT TURNOVER CYSTO (KITS) ×1 IMPLANT
MESH 3DMAX 5X7 LT XLRG (Mesh General) IMPLANT
MESH 3DMAX 5X7 RT XLRG (Mesh General) IMPLANT
NDL INSUFFLATION 14GA 120MM (NEEDLE) ×1 IMPLANT
NEEDLE INSUFFLATION 14GA 120MM (NEEDLE) ×1 IMPLANT
NS IRRIG 500ML POUR BTL (IV SOLUTION) IMPLANT
PACK BASIN DAY SURGERY FS (CUSTOM PROCEDURE TRAY) ×1 IMPLANT
PAD POSITIONING PINK XL (MISCELLANEOUS) ×1 IMPLANT
RELOAD STAPLE 4.0 BLU F/HERNIA (INSTRUMENTS) IMPLANT
RELOAD STAPLE 4.8 BLK F/HERNIA (STAPLE) IMPLANT
RELOAD STAPLE HERNIA 4.0 BLUE (INSTRUMENTS) ×1 IMPLANT
RELOAD STAPLE HERNIA 4.8 BLK (STAPLE) ×1 IMPLANT
SCISSORS LAP 5X35 DISP (ENDOMECHANICALS) ×1 IMPLANT
SET TUBE SMOKE EVAC HIGH FLOW (TUBING) ×1 IMPLANT
SPIKE FLUID TRANSFER (MISCELLANEOUS) IMPLANT
SPONGE T-LAP 18X18 ~~LOC~~+RFID (SPONGE) IMPLANT
STAPLER HERNIA 12 8.5 360D (INSTRUMENTS) IMPLANT
SUT MNCRL AB 4-0 PS2 18 (SUTURE) ×1 IMPLANT
SUT VICRYL 0 UR6 27IN ABS (SUTURE) IMPLANT
TOWEL OR 17X26 10 PK STRL BLUE (TOWEL DISPOSABLE) ×1 IMPLANT
TRAY LAPAROSCOPIC (CUSTOM PROCEDURE TRAY) ×1 IMPLANT
TROCAR Z-THREAD FIOS 12X100MM (TROCAR) ×1 IMPLANT
TROCAR Z-THREAD FIOS 5X100MM (TROCAR) ×2 IMPLANT

## 2022-10-17 NOTE — Op Note (Signed)
Patient: Paul Foster ([birth year], [id number])  Date of Surgery: 10/17/2022   Preoperative Diagnosis: LEFT INGUINAL HERNIA   Postoperative Diagnosis: BILATERAL INGUINAL HERNIA   Surgical Procedure: LAPAROSCOPIC BILATERAL INGUINAL HERNIA REPAIR WITH MESH: [id number]   Operative Team Members:  Surgeon(s) and Role:    * Jessicah Croll, Nickola Major, MD - Primary   Anesthesiologist: Barnet Glasgow, MD CRNA: Suan Halter, CRNA   Anesthesia: General   Fluids:  Total I/O In: 300 [I.V.:200; IV Piggyback:100] Out: 15 [SFKCL:27]  Complications: None  Drains:  None  Specimen: None  Disposition:  PACU - hemodynamically stable.  Plan of Care: Discharge to home after PACU  Indications for Procedure: Paul Foster is a 76 y.o. male who presented with a left inguinal hernia.  I recommended laparoscopic repair.  The procedure, its risks, benefits and alternatives were discussed and the patient granted consent to proceed.  We will proceed as scheduled.  Findings:  Technique: Transabdominal preperitoneal (TAPP) Hernia Location: Bilateral indirect inguinal hernias Mesh Size &Type:  Bard 3D max extra-large right and left meshes (2 meshes) Mesh Fixation: Endo-Universal hernia stapler  Infection status: Patient: Private Patient Elective Case Case: Elective Infection Present At Time Of Surgery (PATOS): None   Description of Procedure:  The patient was positioned supine, padded and secured to the bed, with both arms tucked.  The abdomen was widely prepped and draped.  A time out procedure was performed.  A 1 cm infraumbilical incision was made.  The abdomen was entered without trauma to the underlying viscera.  The abdomen was insufflated to 15 mm of Hg.  A 12 mm trocar was inserted at the periumbilical incision.  Additional 5 mm trocars were placed in the left and right abdomen.  There was no trauma to the underlying viscera.  There was an indirect hernia on the RIGHT.  Utilizing a transabdominal  pre peritoneal technique (TAPP), a horizontal incision was made in the peritoneum, immediately below the umbilicus.  Dissection was carried out in the pre peritoneal space down to the level of the hernia sac which was reduced into the peritoneal cavity completely.  The cord contents were parietalized and preserved.  A large pre peritoneal dissection was performed to uncover the direct, indirect, femoral and obturator spaces.  Cooper's ligament was uncovered medially and the psoas muscle uncovered laterally.  The mesh, as documented above, was opened and advanced into the pre peritoneal position so that it more than adequately covered the indirect, direct, femoral and obturator spaces.  The mesh laid flat, with no inferior folds and covered the entire myopectineal orifice.  The mesh was fixated with the endo-universal hernia stapler to Cooper's ligament and the posterior aspect of the rectus muscle.  The peritoneal flap was closed with the same device.  There were no peritoneal defects or exposed mesh at the conclusion.  There was an indirect hernia on the LEFT.  Utilizing a transabdominal pre peritoneal technique (TAPP), a horizontal incision was made in the peritoneum, immediately below the umbilicus.  Dissection was carried out in the pre peritoneal space down to the level of the hernia sac which was reduced into the peritoneal cavity completely.  The cord contents were parietalized and preserved.  A large pre peritoneal dissection was performed to uncover the direct, indirect, femoral and obturator spaces.  Cooper's ligament was uncovered medially and the psoas muscle uncovered laterally.  The mesh, as documented above, was opened and advanced into the pre peritoneal position so that it more than adequately covered  the indirect, direct, femoral and obturator spaces.  The mesh laid flat, with no inferior folds and covered the entire myopectineal orifice.  The mesh was fixated with the endo-universal hernia  stapler to Cooper's ligament and the posterior aspect of the rectus muscle.  The peritoneal flap was closed with the same device.  There were no peritoneal defects or exposed mesh at the conclusion.  The umbilical trocar was removed and the fascial defect was closed with a 0 Vicryl suture.  The peritoneal cavity was completely desufflated, the trocars removed and the skin closed with 4-0 Monocryl subcuticular suture and skin glue.  All sponge and needle counts were correct at the end of the case.  Louanna Raw, MD General, Bariatric, & Minimally Invasive Surgery [facility], [place]

## 2022-10-17 NOTE — Discharge Instructions (Addendum)
 GROIN HERNIA REPAIR POST OPERATIVE INSTRUCTIONS  Thinking Clearly  The anesthesia may cause you to feel different for 1 or 2 days. Do not drive, drink alcohol, or make any big decisions for at least 2 days.  Nutrition When you wake up, you will be able to drink small amounts of liquid. If you do not feel sick, you can slowly advance your diet to regular foods. Continue to drink lots of fluids, usually about 8 to 10 glasses per day. Eat a high-fiber diet so you don't strain during bowel movements. High-Fiber Foods Foods high in fiber include beans, bran cereals and whole-grain breads, peas, dried fruit (figs, apricots, and dates), raspberries, blackberries, strawberries, sweet corn, broccoli, baked potatoes with skin, plums, pears, apples, greens, and nuts. Activity Slowly increase your activity. Be sure to get up and walk every hour or so to prevent blood clots. No heavy lifting or strenuous activity for 4 weeks following surgery to prevent hernias at your incision sites or recurrence of your hernia. It is normal to feel tired. You may need more sleep than usual.  Get your rest but make sure to get up and move around frequently to prevent blood clots and pneumonia.  Work and Return to School You can go back to work when you feel well enough. Discuss the timing with your surgeon. You can usually go back to school or work 1 week or less after an laparoscopic or an open repair. If your work requires heavy lifting or strenuous activity you need to be placed on light duty for 4 weeks following surgery. You can return to gym class, sports or other physical activities 4 weeks after surgery.  Wound Care You may experience significant bruising in the groin including into the scrotum in males.  Rest, elevating the groin and scrotum above the level of the heart, ice and compression with tight fitting underwear can help.  Always wash your hands before and after touching near your incision site. Do  not soak in a bathtub until cleared at your follow up appointment. You may take a shower 24 hours after surgery. A small amount of drainage from the incision is normal. If the drainage is thick and yellow or the site is red, you may have an infection, so call your surgeon. If you have a drain in one of your incisions, it will be taken out in office when the drainage stops. Steri-Strips will fall off in 7 to 10 days or they will be removed during your first office visit. If you have dermabond glue covering over the incision, allow the glue to flake off on its own. Protect the new skin, especially from the sun. The sun can burn and cause darker scarring. Your scar will heal in about 4 to 6 weeks and will become softer and continue to fade over the next year.  The cosmetic appearance of the incisions will improve over the course of the first year after surgery. Sensation around your incision will return in a few weeks or months.  Bowel Movements After intestinal surgery, you may have loose watery stools for several days. If watery diarrhea lasts longer than 3 days, contact your surgeon. Pain medication (narcotics) can cause constipation. Increase the fiber in your diet with high-fiber foods if you are constipated. You can take an over the counter stool softener like Colace to avoid constipation.  Additional over the counter medications can also be used if Colace isn't sufficient (for example, Milk of Magnesia or Miralax).    Pain The amount of pain is different for each person. Some people need only 1 to 3 doses of pain control medication, while others need more. Take alternating doses of tylenol and ibuprofen around the clock for the first five days following surgery.  This will provide a baseline of pain control and help with inflammation.  Take the narcotic pain medication in addition if needed for severe pain.  Contact Your Surgeon at [phone], if you have: Pain that will not go away Pain that  gets worse A fever of more than 101F (38.3C) Repeated vomiting Swelling, redness, bleeding, or bad-smelling drainage from your wound site Strong abdominal pain No bowel movement or unable to pass gas for 3 days Watery diarrhea lasting longer than 3 days  Pain Control The goal of pain control is to minimize pain, keep you moving and help you heal. Your surgical team will work with you on your pain plan. Most often a combination of therapies and medications are used to control your pain. You may also be given medication (local anesthetic) at the surgical site. This may help control your pain for several days. Extreme pain puts extra stress on your body at a time when your body needs to focus on healing. Do not wait until your pain has reached a level "10" or is unbearable before telling your doctor or nurse. It is much easier to control pain before it becomes severe. Following a laparoscopic procedure, pain is sometimes felt in the shoulder. This is due to the gas inserted into your abdomen during the procedure. Moving and walking helps to decrease the gas and the right shoulder pain.  Use the guide below for ways to manage your post-operative pain. Learn more by going to facs.org/safepaincontrol.  How Intense Is My Pain Common Therapies to Feel Better       I hardly notice my pain, and it does not interfere with my activities.  I notice my pain and it distracts me, but I can still do activities (sitting up, walking, standing).  Non-Medication Therapies  Ice (in a bag, applied over clothing at the surgical site), elevation, rest, meditation, massage, distraction (music, TV, play) walking and mild exercise Splinting the abdomen with pillows +  Non-Opioid Medications Acetaminophen (Tylenol) Non-steroidal anti-inflammatory drugs (NSAIDS) Aspirin, Ibuprofen (Motrin, Advil) Naproxen (Aleve) Take these as needed, when you feel pain. Both acetaminophen and NSAIDs help to decrease pain  and swelling (inflammation).      My pain is hard to ignore and is more noticeable even when I rest.  My pain interferes with my usual activities.  Non-Medication Therapies  +  Non-Opioid medications  Take on a regular schedule (around-the-clock) instead of as needed. (For example, Tylenol every 6 hours at 9:00 am, 3:00 pm, 9:00 pm, 3:00 am and Motrin every 6 hours at 12:00 am, 6:00 am, 12:00 pm, 6:00 pm)         I am focused on my pain, and I am not doing my daily activities.  I am groaning in pain, and I cannot sleep. I am unable to do anything.  My pain is as bad as it could be, and nothing else matters.  Non-Medication Therapies  +  Around-the-Clock Non-Opioid Medications  +  Short-acting opioids  Opioids should be used with other medications to manage severe pain. Opioids block pain and give a feeling of euphoria (feel high). Addiction, a serious side effect of opioids, is rare with short-term (a few days) use.  Examples of short-acting opioids   include: Tramadol (Ultram), Hydrocodone (Norco, Vicodin), Hydromorphone (Dilaudid), Oxycodone (Oxycontin)     The above directions have been adapted from the [employer] Surgical Patient Education Program.  Please refer to the ACS website if needed: PreferredVet.ca.ashx   Louanna Raw, MD [facility], [place], [place], [place], [place]  [address] ?  [address], [place], [place]   [address] [phone] ? [phone] ? FAX [phone] Web site: [email or web address]        Information for Discharge Teaching: EXPAREL (bupivacaine liposome injectable suspension)   Your surgeon or anesthesiologist gave you EXPAREL(bupivacaine) to help control your pain after surgery.  EXPAREL is a local anesthetic that provides pain relief by numbing the tissue around the surgical site. EXPAREL is  designed to release pain medication over time and can control pain for up to 72 hours. Depending on how you respond to EXPAREL, you may require less pain medication during your recovery.  Possible side effects: Temporary loss of sensation or ability to move in the area where bupivacaine was injected. Nausea, vomiting, constipation Rarely, numbness and tingling in your mouth or lips, lightheadedness, or anxiety may occur. Call your doctor right away if you think you may be experiencing any of these sensations, or if you have other questions regarding possible side effects.  Follow all other discharge instructions given to you by your surgeon or nurse. Eat a healthy diet and drink plenty of water or other fluids.  If you return to the hospital for any reason within 96 hours following the administration of EXPAREL, it is important for health care providers to know that you have received this anesthetic. A teal colored band has been placed on your arm with the date, time and amount of EXPAREL you have received in order to alert and inform your health care providers. Please leave this armband in place for the full 96 hours following administration, and then you may remove the band.     No acetaminophen/Tylenol until after 1:30 pm today if needed.     Post Anesthesia Home Care Instructions  Activity: Get plenty of rest for the remainder of the day. A responsible individual must stay with you for 24 hours following the procedure.  For the next 24 hours, DO NOT: -Drive a car -Paediatric nurse -Drink alcoholic beverages -Take any medication unless instructed by your physician -Make any legal decisions or sign important papers.  Meals: Start with liquid foods such as gelatin or soup. Progress to regular foods as tolerated. Avoid greasy, spicy, heavy foods. If nausea and/or vomiting occur, drink only clear liquids until the nausea and/or vomiting subsides. Call your physician if vomiting  continues.  Special Instructions/Symptoms: Your throat may feel dry or sore from the anesthesia or the breathing tube placed in your throat during surgery. If this causes discomfort, gargle with warm salt water. The discomfort should disappear within 24 hours.

## 2022-10-17 NOTE — H&P (Signed)
Admitting Physician: Nickola Major Almetta Liddicoat  Service: General Surgery  CC: Hernia  Subjective   HPI: Paul Foster is an 76 y.o. 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 April Manson,  workup done event monitor, echo, eeg, MRI   Wears glasses     Past Surgical History:  Procedure Laterality Date   APPENDECTOMY  1959   CATARACT EXTRACTION W/ INTRAOCULAR LENS IMPLANT Bilateral 2020   EXCISIONAL HEMORRHOIDECTOMY  1980   TONSILLECTOMY AND [place]    Family History  Problem Relation Age of Onset   Heart attack Mother    Heart attack Father    Heart attack Brother    Heart attack Brother     Social:  reports that he quit smoking about 38 years ago. His smoking use included cigarettes. He has never used smokeless tobacco. He reports current alcohol use of about 2.0 standard drinks of alcohol per week. He reports that he does not use drugs.  Allergies: No Known Allergies  Medications: Current Outpatient Medications  Medication Instructions   aspirin EC 81 mg, Oral, Daily   Calcium Carbonate-Vitamin D (CALTRATE 600+D PO) 1 tablet, Oral, Daily at bedtime   docusate sodium (COLACE) 200 mg, Oral, 2 times daily   fluticasone (FLONASE) 50 MCG/ACT nasal spray 1 spray, Each Nare, Daily PRN   loratadine (CLARITIN) 10 mg, Oral, Daily   lovastatin (MEVACOR) 10 MG tablet TAKE 1 TABLET AT BEDTIME   meloxicam (MOBIC) 15 mg, Oral, Daily   Multiple Vitamin (MULTIVITAMIN) tablet 1 tablet, Oral, Daily at bedtime   Omega-3 Fatty Acids (SUPER OMEGA 3  EPA/DHA) 1000 MG CAPS 1 capsule, Oral, 2 times daily   OVER THE COUNTER MEDICATION 1,600 mg, Oral, Daily, Blueberry capsule for prostate health   vitamin C 1,000 mg, Oral, Daily at bedtime   Vitamin D3 Maximum Strength 5,000 Units, Oral, Daily   Wheat Dextrin (BENEFIBER PO) Oral, 2 times daily, One tablespoon    ROS - all of the below systems have been reviewed with the patient and positives are indicated with bold text General: chills, fever or night sweats Eyes: blurry vision or double vision ENT: epistaxis or sore throat Allergy/Immunology: itchy/watery eyes or nasal congestion Hematologic/Lymphatic: bleeding problems, blood clots or swollen lymph nodes Endocrine: temperature intolerance or unexpected weight changes Breast: new or changing breast lumps or nipple discharge Resp: cough, shortness of breath, or wheezing CV: chest pain or dyspnea on exertion GI: as per HPI GU: dysuria, trouble voiding, or hematuria MSK: joint pain or joint stiffness Neuro: TIA or stroke symptoms Derm: pruritus and skin lesion changes Psych: anxiety and depression  Objective   PE Height '6\' 1"'$  (1.854 m), weight 90.7 kg. Constitutional: NAD; conversant; no deformities Eyes: Moist conjunctiva; no lid lag; anicteric; PERRL Neck: Trachea midline; no thyromegaly Lungs: Normal respiratory effort; no tactile fremitus CV: RRR; no palpable thrills; no pitting edema GI: Abd Left inguinal hernia; no palpable hepatosplenomegaly MSK: Normal range of motion of extremities; no clubbing/cyanosis Psychiatric: Appropriate affect; alert and oriented x3  Lymphatic: No palpable cervical or axillary lymphadenopathy  No results found for this or any previous visit (from the past 24 hour(s)).  Imaging Orders  No imaging studies ordered today     Assessment and Plan   Paul Foster is an 76 y.o. male with a Left inguinal hernia    I recommended laparoscopic left inguinal hernia repair with mesh. The procedure,  its risks, benefits and alternatives were discussed and the patient granted consent to proceed.   Felicie Morn, MD  [facility] Use [email or web address] to contact on call provider

## 2022-10-17 NOTE — Anesthesia Procedure Notes (Signed)
Procedure Name: Intubation Date/Time: 10/17/2022 9:10 AM  Performed by: Suan Halter, CRNAPre-anesthesia Checklist: Patient identified, Emergency Drugs available, Suction available and Patient being monitored Patient Re-evaluated:Patient Re-evaluated prior to induction Oxygen Delivery Method: Circle system utilized Preoxygenation: Pre-oxygenation with 100% oxygen Induction Type: IV induction Ventilation: Mask ventilation without difficulty Laryngoscope Size: Mac and 4 Grade View: Grade I Tube type: Oral Tube size: 7.5 mm Number of attempts: 1 Airway Equipment and Method: Stylet and Oral airway Placement Confirmation: ETT inserted through vocal cords under direct vision, positive ETCO2 and breath sounds checked- equal and bilateral Secured at: 22 cm Tube secured with: Tape Dental Injury: Teeth and Oropharynx as per pre-operative assessment

## 2022-10-17 NOTE — Anesthesia Postprocedure Evaluation (Signed)
Anesthesia Post Note  Patient: Paul Foster  Procedure(s) Performed: LAPAROSCOPIC BILATERAL INGUINAL HERNIA REPAIR WITH MESH (Bilateral: Inguinal)     Patient location during evaluation: PACU Anesthesia Type: General Level of consciousness: awake and alert Pain management: pain level controlled Vital Signs Assessment: post-procedure vital signs reviewed and stable Respiratory status: spontaneous breathing, nonlabored ventilation, respiratory function stable and patient connected to nasal cannula oxygen Cardiovascular status: blood pressure returned to baseline and stable Postop Assessment: no apparent nausea or vomiting Anesthetic complications: no  No notable events documented.  Last Vitals:  Vitals:   10/17/22 1100 10/17/22 1115  BP: (!) 156/99 (!) 154/91  Pulse: 73 71  Resp: 14 18  Temp:    SpO2: 99% 99%    Last Pain:  Vitals:   10/17/22 1100  TempSrc:   PainSc: 0-No pain                 Barnet Glasgow

## 2022-10-17 NOTE — Transfer of Care (Signed)
Immediate Anesthesia Transfer of Care Note  Patient: Paul Foster  Procedure(s) Performed: Procedure(s) (LRB): LAPAROSCOPIC BILATERAL INGUINAL HERNIA REPAIR WITH MESH (Bilateral)  Patient Location: PACU  Anesthesia Type: General  Level of Consciousness: awake, oriented, sedated and patient cooperative  Airway & Oxygen Therapy: Patient Spontanous Breathing and Patient connected to face mask oxygen  Post-op Assessment: Report given to PACU RN and Post -op Vital signs reviewed and stable  Post vital signs: Reviewed and stable  Complications: No apparent anesthesia complications  Last Vitals:  Vitals Value Taken Time  BP 168/107 10/17/22 1045  Temp    Pulse 73 10/17/22 1047  Resp 10 10/17/22 1047  SpO2 100 % 10/17/22 1047  Vitals shown include unvalidated device data.  Last Pain:  Vitals:   10/17/22 0722  TempSrc: Oral  PainSc: 3       Patients Stated Pain Goal: 3 (94/76/54 6503)  Complications: No notable events documented.

## 2022-10-20 ENCOUNTER — Encounter (HOSPITAL_BASED_OUTPATIENT_CLINIC_OR_DEPARTMENT_OTHER): Payer: Self-pay | Admitting: Surgery

## 2022-10-23 ENCOUNTER — Other Ambulatory Visit: Payer: Self-pay

## 2022-10-23 ENCOUNTER — Telehealth: Payer: Self-pay | Admitting: Family Medicine

## 2022-10-23 MED ORDER — FLUTICASONE PROPIONATE 50 MCG/ACT NA SUSP: 1.0000 | Freq: Every day | NASAL | 3 refills | Status: DC | PRN

## 2022-10-23 NOTE — Telephone Encounter (Signed)
  Patient states he was to receive 3 refills in one [facility] with [facility]  for his fluticasone (FLONASE) 50 MCG/ACT nasal spray  . Stated he received only one and called [facility] to cancel the order as he thought it was the office fault.   Patient has 3 refills that are now cancelled per his statement. He wants office to re order same medication with 3 refills.

## 2022-10-23 NOTE — Telephone Encounter (Signed)
Rx sent 

## 2022-10-28 ENCOUNTER — Encounter: Payer: Self-pay | Admitting: Family Medicine

## 2022-10-28 NOTE — Telephone Encounter (Signed)
Patient states the following RX should be for 3 vials per RX which is a 90 day supply of  fluticasone (FLONASE) 50 MCG/ACT nasal spray.  Patient requests the above RX be resent to  [place], [place] Phone: [phone]  Fax: [phone]

## 2022-10-29 ENCOUNTER — Other Ambulatory Visit: Payer: Self-pay

## 2022-10-30 ENCOUNTER — Other Ambulatory Visit: Payer: Self-pay

## 2022-10-30 ENCOUNTER — Other Ambulatory Visit: Payer: Self-pay | Admitting: Urology

## 2022-10-30 DIAGNOSIS — C61 Malignant neoplasm of prostate: Secondary | ICD-10-CM

## 2022-10-30 MED ORDER — FLUTICASONE PROPIONATE 50 MCG/ACT NA SUSP: 1.0000 | Freq: Every day | NASAL | 3 refills | Status: DC | PRN

## 2022-10-30 NOTE — Telephone Encounter (Signed)
Rx sent to [facility]

## 2022-11-04 ENCOUNTER — Other Ambulatory Visit: Payer: Self-pay | Admitting: Sports Medicine

## 2022-11-11 NOTE — Progress Notes (Unsigned)
    Paul Foster D.Paul Foster Phone: [phone]   Assessment and Plan:     There are no diagnoses linked to this encounter.  ***   Pertinent previous records reviewed include ***   Follow Up: ***     Subjective:   I, Paul Foster, am serving as a Education administrator for Doctor Paul Foster   Chief Complaint: right knee pain    HPI:    10/08/22 Patient is a 76 year old male complaining of right knee pain . Patient states that he was moving equipment yesterday and he tried to use it as leverage and he felt a pop an immediate pain , TTP, he has pain walking , states he didn't lock up , no numbness or tingling , no radiating pain , pain is all underneath or inside the knee cap, no meds for the pain this time   11/12/2022 Patient states   Relevant Historical Information: History of prostate cancer  Additional pertinent review of systems negative.   Current Outpatient Medications:    Ascorbic Acid (VITAMIN C) 1000 MG tablet, Take 1,000 mg by mouth at bedtime., Disp: , Rfl:    aspirin 81 MG EC tablet, Take 81 mg by mouth daily., Disp: , Rfl:    Calcium Carbonate-Vitamin D (CALTRATE 600+D PO), Take 1 tablet by mouth at bedtime., Disp: , Rfl:    Cholecalciferol (VITAMIN D3 MAXIMUM STRENGTH) 125 MCG (5000 UT) capsule, Take 5,000 Units by mouth daily., Disp: , Rfl:    docusate sodium (COLACE) 100 MG capsule, Take 200 mg by mouth 2 (two) times daily., Disp: , Rfl:    fluticasone (FLONASE) 50 MCG/ACT nasal spray, Place 1 spray into both nostrils daily as needed for allergies., Disp: 48 g, Rfl: 3   loratadine (CLARITIN) 10 MG tablet, Take 10 mg by mouth daily., Disp: , Rfl:    lovastatin (MEVACOR) 10 MG tablet, TAKE 1 TABLET AT BEDTIME (Patient taking differently: Take 10 mg by mouth at bedtime.), Disp: 90 tablet, Rfl: 3   meloxicam (MOBIC) 15 MG tablet, Take 1 tablet (15 mg total) by mouth daily., Disp: 30 tablet, Rfl:  0   Multiple Vitamin (MULTIVITAMIN) tablet, Take 1 tablet by mouth at bedtime., Disp: , Rfl:    Omega-3 Fatty Acids (SUPER OMEGA 3 EPA/DHA) 1000 MG CAPS, Take 1 capsule by mouth in the morning and at bedtime., Disp: , Rfl:    OVER THE COUNTER MEDICATION, Take 1,600 mg by mouth daily. Blueberry capsule for prostate health, Disp: , Rfl:    oxyCODONE-acetaminophen (PERCOCET) 5-325 MG tablet, Take 1 tablet by mouth every 4 (four) hours as needed for severe pain., Disp: 20 tablet, Rfl: 0   Wheat Dextrin (BENEFIBER PO), Take by mouth 2 (two) times daily. One tablespoon, Disp: , Rfl:    Objective:     There were no vitals filed for this visit.    There is no height or weight on file to calculate BMI.    Physical Exam:    ***   Electronically signed by:  Paul Foster D.[facility] 3:45 PM 11/11/22

## 2022-11-12 ENCOUNTER — Ambulatory Visit (INDEPENDENT_AMBULATORY_CARE_PROVIDER_SITE_OTHER): Payer: Medicare Other | Admitting: Sports Medicine

## 2022-11-12 VITALS — BP 130/82 | HR 49 | Ht 73.0 in | Wt 204.0 lb

## 2022-11-12 DIAGNOSIS — M1711 Unilateral primary osteoarthritis, right knee: Secondary | ICD-10-CM

## 2022-11-12 DIAGNOSIS — M25561 Pain in right knee: Secondary | ICD-10-CM | POA: Diagnosis not present

## 2022-11-12 DIAGNOSIS — G8929 Other chronic pain: Secondary | ICD-10-CM

## 2022-11-12 NOTE — Patient Instructions (Signed)
Good to see you   

## 2022-11-30 ENCOUNTER — Ambulatory Visit
Admission: RE | Admit: 2022-11-30 | Discharge: 2022-11-30 | Disposition: A | Discharge status: Home / Self Care | Payer: Medicare Other | Source: Ambulatory Visit | Attending: Urology | Admitting: Urology

## 2022-11-30 DIAGNOSIS — C61 Malignant neoplasm of prostate: Secondary | ICD-10-CM

## 2022-11-30 MED ORDER — GADOPICLENOL 0.5 MMOL/ML IV SOLN
10.0000 mL | Freq: Once | INTRAVENOUS | Status: AC | PRN
  Administered 2022-11-30: 10 mL via INTRAVENOUS

## 2023-07-27 ENCOUNTER — Ambulatory Visit (INDEPENDENT_AMBULATORY_CARE_PROVIDER_SITE_OTHER): Payer: Medicare Other

## 2023-07-27 VITALS — BP 138/82 | HR 82 | Temp 97.3°F | Wt 199.0 lb

## 2023-07-27 DIAGNOSIS — Z0000 Encounter for general adult medical examination without abnormal findings: Secondary | ICD-10-CM | POA: Diagnosis not present

## 2023-07-27 NOTE — Patient Instructions (Signed)
[name] , Thank you for taking time to come for your Medicare Wellness Visit. I appreciate your ongoing commitment to your health goals. Please review the following plan we discussed and let me know if I can assist you in the future.   Referrals/Orders/Follow-Ups/Clinician Recommendations: stay healthy and active   This is a list of the screening recommended for you and due dates:  Health Maintenance  Topic Date Due   COVID-19 Vaccine (4 - 2023-24 season) 08/15/2022   Medicare Annual Wellness Visit  07/22/2023   Flu Shot  07/16/2023   Colon Cancer Screening  09/10/2023*   DTaP/Tdap/Td vaccine (2 - Td or Tdap) 01/06/2029   Pneumonia Vaccine  Completed   Zoster (Shingles) Vaccine  Completed   HPV Vaccine  Aged Out   Hepatitis C Screening  Discontinued  *Topic was postponed. The date shown is not the original due date.    Advanced directives: (In Chart) A copy of your advanced directives are scanned into your chart should your provider ever need it.  Next Medicare Annual Wellness Visit scheduled for next year: Yes  Preventive Care 1 Years and Older, Male  Preventive care refers to lifestyle choices and visits with your health care provider that can promote health and wellness. What does preventive care include? A yearly physical exam. This is also called an annual well check. Dental exams once or twice a year. Routine eye exams. Ask your health care provider how often you should have your eyes checked. Personal lifestyle choices, including: Daily care of your teeth and gums. Regular physical activity. Eating a healthy diet. Avoiding tobacco and drug use. Limiting alcohol use. Practicing safe sex. Taking low doses of aspirin every day. Taking vitamin and mineral supplements as recommended by your health care provider. What happens during an annual well check? The services and screenings done by your health care provider during your annual well check will depend on your age,  overall health, lifestyle risk factors, and family history of disease. Counseling  Your health care provider may ask you questions about your: Alcohol use. Tobacco use. Drug use. Emotional well-being. Home and relationship well-being. Sexual activity. Eating habits. History of falls. Memory and ability to understand (cognition). Work and work Astronomer. Screening  You may have the following tests or measurements: Height, weight, and BMI. Blood pressure. Lipid and cholesterol levels. These may be checked every 5 years, or more frequently if you are over 36 years old. Skin check. Lung cancer screening. You may have this screening every year starting at age 27 if you have a 30-pack-year history of smoking and currently smoke or have quit within the past 15 years. Fecal occult blood test (FOBT) of the stool. You may have this test every year starting at age 88. Flexible sigmoidoscopy or colonoscopy. You may have a sigmoidoscopy every 5 years or a colonoscopy every 10 years starting at age 3. Prostate cancer screening. Recommendations will vary depending on your family history and other risks. Hepatitis C blood test. Hepatitis B blood test. Sexually transmitted disease (STD) testing. Diabetes screening. This is done by checking your blood sugar (glucose) after you have not eaten for a while (fasting). You may have this done every 1-3 years. Abdominal aortic aneurysm (AAA) screening. You may need this if you are a current or former smoker. Osteoporosis. You may be screened starting at age 45 if you are at high risk. Talk with your health care provider about your test results, treatment options, and if necessary, the need  for more tests. Vaccines  Your health care provider may recommend certain vaccines, such as: Influenza vaccine. This is recommended every year. Tetanus, diphtheria, and acellular pertussis (Tdap, Td) vaccine. You may need a Td booster every 10 years. Zoster vaccine.  You may need this after age 60. Pneumococcal 13-valent conjugate (PCV13) vaccine. One dose is recommended after age 23. Pneumococcal polysaccharide (PPSV23) vaccine. One dose is recommended after age 64. Talk to your health care provider about which screenings and vaccines you need and how often you need them. This information is not intended to replace advice given to you by your health care provider. Make sure you discuss any questions you have with your health care provider. Document Released: 12/28/2015 Document Revised: 08/20/2016 Document Reviewed: 10/02/2015 Elsevier Interactive Patient Education  2017 [employer].  Fall Prevention in the Home Falls can cause injuries. They can happen to people of all ages. There are many things you can do to make your home safe and to help prevent falls. What can I do on the outside of my home? Regularly fix the edges of walkways and driveways and fix any cracks. Remove anything that might make you trip as you walk through a door, such as a raised step or threshold. Trim any bushes or trees on the path to your home. Use bright outdoor lighting. Clear any walking paths of anything that might make someone trip, such as rocks or tools. Regularly check to see if handrails are loose or broken. Make sure that both sides of any steps have handrails. Any raised decks and porches should have guardrails on the edges. Have any leaves, snow, or ice cleared regularly. Use sand or salt on walking paths during winter. Clean up any spills in your garage right away. This includes oil or grease spills. What can I do in the bathroom? Use night lights. Install grab bars by the toilet and in the tub and shower. Do not use towel bars as grab bars. Use non-skid mats or decals in the tub or shower. If you need to sit down in the shower, use a plastic, non-slip stool. Keep the floor dry. Clean up any water that spills on the floor as soon as it happens. Remove soap  buildup in the tub or shower regularly. Attach bath mats securely with double-sided non-slip rug tape. Do not have throw rugs and other things on the floor that can make you trip. What can I do in the bedroom? Use night lights. Make sure that you have a light by your bed that is easy to reach. Do not use any sheets or blankets that are too big for your bed. They should not hang down onto the floor. Have a firm chair that has side arms. You can use this for support while you get dressed. Do not have throw rugs and other things on the floor that can make you trip. What can I do in the kitchen? Clean up any spills right away. Avoid walking on wet floors. Keep items that you use a lot in easy-to-reach places. If you need to reach something above you, use a strong step stool that has a grab bar. Keep electrical cords out of the way. Do not use floor polish or wax that makes floors slippery. If you must use wax, use non-skid floor wax. Do not have throw rugs and other things on the floor that can make you trip. What can I do with my stairs? Do not leave any items on the stairs.  Make sure that there are handrails on both sides of the stairs and use them. Fix handrails that are broken or loose. Make sure that handrails are as long as the stairways. Check any carpeting to make sure that it is firmly attached to the stairs. Fix any carpet that is loose or worn. Avoid having throw rugs at the top or bottom of the stairs. If you do have throw rugs, attach them to the floor with carpet tape. Make sure that you have a light switch at the top of the stairs and the bottom of the stairs. If you do not have them, ask someone to add them for you. What else can I do to help prevent falls? Wear shoes that: Do not have high heels. Have rubber bottoms. Are comfortable and fit you well. Are closed at the toe. Do not wear sandals. If you use a stepladder: Make sure that it is fully opened. Do not climb a closed  stepladder. Make sure that both sides of the stepladder are locked into place. Ask someone to hold it for you, if possible. Clearly mark and make sure that you can see: Any grab bars or handrails. First and last steps. Where the edge of each step is. Use tools that help you move around (mobility aids) if they are needed. These include: Canes. Walkers. Scooters. Crutches. Turn on the lights when you go into a dark area. Replace any light bulbs as soon as they burn out. Set up your furniture so you have a clear path. Avoid moving your furniture around. If any of your floors are uneven, fix them. If there are any pets around you, be aware of where they are. Review your medicines with your doctor. Some medicines can make you feel dizzy. This can increase your chance of falling. Ask your doctor what other things that you can do to help prevent falls. This information is not intended to replace advice given to you by your health care provider. Make sure you discuss any questions you have with your health care provider. Document Released: 09/27/2009 Document Revised: 05/08/2016 Document Reviewed: 01/05/2015 Elsevier Interactive Patient Education  2017 [employer].

## 2023-07-27 NOTE — Progress Notes (Addendum)
Subjective:   Paul Foster is a 77 y.o. male who presents for Medicare Annual/Subsequent preventive examination.  Visit Complete: In person   Review of Systems     Cardiac Risk Factors include: advanced age (>16men, >35 women);male gender     Objective:    Today's Vitals   07/27/23 0800  BP: 138/82  Pulse: 82  Temp: (!) 97.3 F (36.3 C)  SpO2: 99%  Weight: 199 lb (90.3 kg)   Body mass index is 26.25 kg/m.     07/27/2023    8:07 AM 10/17/2022    7:17 AM 07/21/2022    8:11 AM 07/15/2021   11:07 AM  Advanced Directives  Does Patient Have a Medical Advance Directive? Yes Yes Yes Yes  Type of Estate agent of [place];Living will  Healthcare Power of [place];Living will Healthcare Power of Attorney  Does patient want to make changes to medical advance directive? No - Patient declined No - Guardian declined    Copy of Healthcare Power of Attorney in Chart? Yes - validated most recent copy scanned in chart (See row information)  No - copy requested No - copy requested    Current Medications (verified) Outpatient Encounter Medications as of 07/27/2023  Medication Sig   Ascorbic Acid (VITAMIN C) 1000 MG tablet Take 1,000 mg by mouth at bedtime.   aspirin 81 MG EC tablet Take 81 mg by mouth daily.   Calcium Carbonate-Vitamin D (CALTRATE 600+D PO) Take 1 tablet by mouth at bedtime.   Cholecalciferol (VITAMIN D3 MAXIMUM STRENGTH) 125 MCG (5000 UT) capsule Take 5,000 Units by mouth daily.   docusate sodium (COLACE) 100 MG capsule Take 200 mg by mouth 2 (two) times daily.   fluticasone (FLONASE) 50 MCG/ACT nasal spray Place 1 spray into both nostrils daily as needed for allergies.   loratadine (CLARITIN) 10 MG tablet Take 10 mg by mouth daily.   lovastatin (MEVACOR) 10 MG tablet TAKE 1 TABLET AT BEDTIME (Patient taking differently: Take 10 mg by mouth at bedtime.)   meloxicam (MOBIC) 15 MG tablet Take 1 tablet (15 mg total) by mouth daily.   Multiple Vitamin  (MULTIVITAMIN) tablet Take 1 tablet by mouth at bedtime.   Omega-3 Fatty Acids (SUPER OMEGA 3 EPA/DHA) 1000 MG CAPS Take 1 capsule by mouth in the morning and at bedtime.   OVER THE COUNTER MEDICATION Take 1,600 mg by mouth daily. Blueberry capsule for prostate health   Wheat Dextrin (BENEFIBER PO) Take by mouth 2 (two) times daily. One tablespoon   [DISCONTINUED] oxyCODONE-acetaminophen (PERCOCET) 5-325 MG tablet Take 1 tablet by mouth every 4 (four) hours as needed for severe pain.   No facility-administered encounter medications on file as of 07/27/2023.    Allergies (verified) Patient has no known allergies.   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 Teresa Coombs,  workup done event monitor, echo, eeg, MRI   Wears glasses    Past Surgical History:  Procedure Laterality Date   APPENDECTOMY  1959   CATARACT EXTRACTION W/ INTRAOCULAR LENS IMPLANT Bilateral 2020   EXCISIONAL HEMORRHOIDECTOMY  1980   INGUINAL HERNIA REPAIR Bilateral 10/17/2022   Procedure: LAPAROSCOPIC BILATERAL INGUINAL HERNIA REPAIR WITH MESH;  Surgeon: Stechschulte, Hyman Hopes, MD;  Location: [facility];  Service: General;  Laterality: Bilateral;   TONSILLECTOMY AND ADENOIDECTOMY  1951   Family History  Problem Relation Age of Onset   Heart attack Mother    Heart attack Father    Heart attack Brother    Heart attack Brother    Social History   Socioeconomic History   Marital status: Widowed    Spouse name: [place]   Number of children: 0   Years of education: some college   Highest education level:  Not on file  Occupational History   Occupation: Retired  Tobacco Use   Smoking status: Former    Current packs/day: 0.00    Types: Cigarettes    Start date: 1965    Quit date: 1985    Years since quitting: 39.6   Smokeless tobacco: Never   Tobacco comments:    Quit 1990  Vaping Use   Vaping status: Never Used  Substance and Sexual Activity   Alcohol use: Yes    Alcohol/week: 2.0 standard drinks of alcohol    Types: 2 Cans of beer per week   Drug use: Never   Sexual activity: Not on file  Other Topics Concern   Not on file  Social History Narrative   Wife, Roko Rayan, passed July 02, 2020.   Lives alone.   Right-handed.   Caffeine use: 40 ounces per day (1/2 caff - 1/ decaf coffee)   Social Determinants of Health   Financial Resource Strain: Low Risk  (07/27/2023)   Overall Financial Resource Strain (CARDIA)    Difficulty of Paying Living Expenses: Not hard at all  Food Insecurity: No Food Insecurity (07/27/2023)   Hunger Vital Sign    Worried About Running Out of Food in the Last Year: Never true    Ran Out of Food in the Last Year: Never true  Transportation Needs: No Transportation Needs (07/27/2023)   PRAPARE - Administrator, Civil Service (Medical): No    Lack of Transportation (Non-Medical): No  Physical Activity: Sufficiently Active (07/27/2023)   Exercise Vital Sign    Days of Exercise per Week: 7 days    Minutes of Exercise per Session: 30 min  Stress: No Stress Concern Present (07/27/2023)   [employer] of Occupational Health - Occupational Stress Questionnaire    Feeling of Stress : Not at all  Social Connections: Moderately Integrated (07/27/2023)   Social Connection and Isolation Panel [NHANES]    Frequency of Communication with Friends and Family: More than three times a week    Frequency of Social Gatherings with Friends and Family: More than three times a week    Attends Religious Services: More than 4 times per year    Active Member  of [employer] or Organizations: Yes    Attends Banker Meetings: 1 to 4 times per year    Marital Status: Widowed    Tobacco Counseling Counseling given: Not Answered Tobacco comments: Quit 1990   Clinical Intake:  Pre-visit preparation completed: Yes  Pain : No/denies pain     BMI - recorded: 26.25 Nutritional Status: BMI 25 -29 Overweight Nutritional Risks: None Diabetes: No  How often do you need to have someone help you when you read instructions, pamphlets, or other written materials from your doctor or pharmacy?: 1 - Never  Interpreter Needed?: No  Information entered by :: Lanier Ensign, LPN   Activities of Daily Living    07/27/2023    8:05 AM 10/17/2022    7:24 AM  In your present state of health, do you have any difficulty performing the following activities:  Hearing? 0 0  Vision? 0 0  Difficulty concentrating or making decisions? 0 0  Walking or climbing stairs? 0 0  Dressing or bathing? 0 0  Doing errands, shopping? 0   Preparing Food and eating ? N   Using the Toilet? N   In the past six months, have you accidently leaked urine? N   Do you have problems with loss of bowel control? N   Managing your Medications? N   Managing your Finances? N   Housekeeping or managing your Housekeeping? N     Patient Care Team: Willow Ora, MD as PCP - General (Family Medicine) Windell Norfolk, MD as Consulting Physician (Neurology) Rene Paci, MD as Consulting Physician (Urology) Jerilee Field, MD as Consulting Physician (Urology) Aris Lot, MD as Consulting Physician (Dermatology) Sherrie Mustache, Lawana Pai., DMD (Dentistry) Chiropractic, [facility]  Indicate any recent Medical Services you may have received from other than [facility] providers in the past year (date may be approximate).     Assessment:   This is a routine wellness examination for [facility].  Hearing/Vision screen Hearing Screening - Comments:: Pt denies any  hearing issues  Vision Screening - Comments:: Pt follows up with [facility] ophthalmology   Dietary issues and exercise activities discussed:     Goals Addressed             This Visit's Progress    Patient Stated       Stay healthy and active        Depression Screen    07/27/2023    8:07 AM 09/15/2022    4:29 PM 09/09/2022   10:28 AM 08/13/2022    9:30 AM 07/21/2022    8:09 AM 07/15/2021   11:06 AM 11/22/2019   10:54 AM  PHQ 2/9 Scores  PHQ - 2 Score 0 0 0 0 0 0 0  PHQ- 9 Score  0 0        Fall Risk    07/27/2023    8:09 AM 09/15/2022    4:29 PM 09/09/2022   10:28 AM 08/13/2022    9:30 AM 07/21/2022    8:12 AM  Fall Risk   Falls in the past year? 1 0 0 0 0  Number falls in past yr: 1 0 0 0 0  Comment miss stepped      Injury with Fall? 0 0 0 0 0  Risk for fall due to : Impaired vision No Fall Risks No Fall Risks No Fall Risks Impaired vision  Follow up Falls prevention discussed Falls evaluation completed Falls evaluation completed Falls evaluation completed Falls prevention discussed    MEDICARE RISK AT HOME:  Medicare Risk at Home - 07/27/23 0810     Any stairs in or around the home? Yes    If so, are there any without handrails? No    Home free of loose throw rugs in walkways, pet beds, electrical cords, etc? Yes    Adequate lighting in your home to reduce risk of falls? Yes    Life alert? No    Use of a cane, walker or w/c? No    Grab bars in the bathroom? Yes    Shower chair or bench in shower? Yes  Elevated toilet seat or a handicapped toilet? No             TIMED UP AND GO:  Was the test performed?  Yes  Length of time to ambulate 10 feet: 10 sec Gait steady and fast without use of assistive device    Cognitive Function:        07/27/2023    8:10 AM 07/21/2022    8:13 AM 07/15/2021   11:12 AM  6CIT Screen  What Year? 0 points 0 points 0 points  What month? 0 points 0 points 0 points  What time? 0 points 0 points 0 points  Count back from  20 0 points 0 points 0 points  Months in reverse 0 points 0 points 0 points  Repeat phrase 0 points 0 points 2 points  Total Score 0 points 0 points 2 points    Immunizations Immunization History  Administered Date(s) Administered   Hepatitis B 03/09/1992   Influenza-Unspecified 10/07/2018, 08/30/2019, 09/17/2020   PFIZER(Purple Top)SARS-COV-2 Vaccination 01/20/2020, 02/14/2020, 09/18/2020   Pneumococcal Conjugate-13 10/28/2016   Pneumococcal Polysaccharide-23 10/07/2018   Tdap 01/06/2019   Zoster Recombinant(Shingrix) 10/07/2018, 01/06/2019    TDAP status: Due, Education has been provided regarding the importance of this vaccine. Advised may receive this vaccine at local pharmacy or Health Dept. Aware to provide a copy of the vaccination record if obtained from local pharmacy or Health Dept. Verbalized acceptance and understanding.  Flu Vaccine status: Due, Education has been provided regarding the importance of this vaccine. Advised may receive this vaccine at local pharmacy or Health Dept. Aware to provide a copy of the vaccination record if obtained from local pharmacy or Health Dept. Verbalized acceptance and understanding.  Pneumococcal vaccine status: Up to date  Covid-19 vaccine status: Completed vaccines  Qualifies for Shingles Vaccine? Yes   Zostavax completed Yes   Shingrix Completed?: Yes  Screening Tests Health Maintenance  Topic Date Due   COVID-19 Vaccine (4 - 2023-24 season) 08/15/2022   INFLUENZA VACCINE  07/16/2023   Colonoscopy  09/10/2023 (Originally 04/06/2022)   Medicare Annual Wellness (AWV)  07/26/2024   DTaP/Tdap/Td (2 - Td or Tdap) 01/06/2029   Pneumonia Vaccine 79+ Years old  Completed   Zoster Vaccines- Shingrix  Completed   HPV VACCINES  Aged Out   Hepatitis C Screening  Discontinued    Health Maintenance  Health Maintenance Due  Topic Date Due   COVID-19 Vaccine (4 - 2023-24 season) 08/15/2022   INFLUENZA VACCINE  07/16/2023     Colorectal cancer screening: Type of screening: Colonoscopy. Completed 04/06/17. Repeat every 5 years pt stated he waiting on a call back    Additional Screening:  Hepatitis C Screening:  Completed 11/22/19  Vision Screening: Recommended annual ophthalmology exams for early detection of glaucoma and other disorders of the eye. Is the patient up to date with their annual eye exam?  Yes  Who is the provider or what is the name of the office in which the patient attends annual eye exams? [facility]  If pt is not established with a provider, would they like to be referred to a provider to establish care? No .   Dental Screening: Recommended annual dental exams for proper oral hygiene   Community Resource Referral / Chronic Care Management: CRR required this visit?  No   CCM required this visit?  No     Plan:     I have personally reviewed and noted the following in the patient's chart:  Medical and social history Use of alcohol, tobacco or illicit drugs  Current medications and supplements including opioid prescriptions. Patient is not currently taking opioid prescriptions. Functional ability and status Nutritional status Physical activity Advanced directives List of other physicians Hospitalizations, surgeries, and ER visits in previous 12 months Vitals Screenings to include cognitive, depression, and falls Referrals and appointments  In addition, I have reviewed and discussed with patient certain preventive protocols, quality metrics, and best practice recommendations. A written personalized care plan for preventive services as well as general preventive health recommendations were provided to patient.     Marzella Schlein, LPN   01/28/864   After Visit Summary: (MyChart) Due to this being a telephonic visit, the after visit summary with patients personalized plan was offered to patient via MyChart   Nurse Notes: none

## 2023-09-07 ENCOUNTER — Other Ambulatory Visit: Payer: Self-pay | Admitting: Family Medicine

## 2023-09-08 ENCOUNTER — Encounter: Payer: Self-pay | Admitting: Gastroenterology

## 2023-10-07 ENCOUNTER — Encounter: Payer: Self-pay | Admitting: Family Medicine

## 2023-10-07 ENCOUNTER — Ambulatory Visit: Payer: Medicare Other | Admitting: Family Medicine

## 2023-10-07 VITALS — BP 122/70 | HR 60 | Temp 97.7°F | Ht 73.0 in | Wt 198.8 lb

## 2023-10-07 DIAGNOSIS — E782 Mixed hyperlipidemia: Secondary | ICD-10-CM

## 2023-10-07 DIAGNOSIS — G454 Transient global amnesia: Secondary | ICD-10-CM

## 2023-10-07 DIAGNOSIS — C61 Malignant neoplasm of prostate: Secondary | ICD-10-CM

## 2023-10-07 DIAGNOSIS — K635 Polyp of colon: Secondary | ICD-10-CM | POA: Diagnosis not present

## 2023-10-07 DIAGNOSIS — R030 Elevated blood-pressure reading, without diagnosis of hypertension: Secondary | ICD-10-CM

## 2023-10-07 LAB — COMPREHENSIVE METABOLIC PANEL
ALT: 18 U/L (ref 0–53)
AST: 20 U/L (ref 0–37)
Albumin: 4.3 g/dL (ref 3.5–5.2)
Alkaline Phosphatase: 55 U/L (ref 39–117)
BUN: 19 mg/dL (ref 6–23)
CO2: 26 meq/L (ref 19–32)
Calcium: 10 mg/dL (ref 8.4–10.5)
Chloride: 104 meq/L (ref 96–112)
Creatinine, Ser: 1.3 mg/dL (ref 0.40–1.50)
GFR: 52.41 mL/min — ABNORMAL LOW (ref >60.00)
Glucose, Bld: 98 mg/dL (ref 70–99)
Potassium: 4.9 meq/L (ref 3.5–5.1)
Sodium: 137 meq/L (ref 135–145)
Total Bilirubin: 0.8 mg/dL (ref 0.2–1.2)
Total Protein: 7.1 g/dL (ref 6.0–8.3)

## 2023-10-07 LAB — CBC WITH DIFFERENTIAL/PLATELET
Basophils Absolute: 0 10*3/uL (ref 0.0–0.1)
Basophils Relative: 0.7 % (ref 0.0–3.0)
Eosinophils Absolute: 0.1 10*3/uL (ref 0.0–0.7)
Eosinophils Relative: 2.1 % (ref 0.0–5.0)
HCT: 54.6 % — ABNORMAL HIGH (ref 39.0–52.0)
Hemoglobin: 17.9 g/dL — ABNORMAL HIGH (ref 13.0–17.0)
Lymphocytes Relative: 44.7 % (ref 12.0–46.0)
Lymphs Abs: 2.8 10*3/uL (ref 0.7–4.0)
MCHC: 32.7 g/dL (ref 30.0–36.0)
MCV: 99.9 fL (ref 78.0–100.0)
Monocytes Absolute: 0.5 10*3/uL (ref 0.1–1.0)
Monocytes Relative: 8 % (ref 3.0–12.0)
Neutro Abs: 2.7 10*3/uL (ref 1.4–7.7)
Neutrophils Relative %: 44.5 % (ref 43.0–77.0)
Platelets: 253 10*3/uL (ref 150.0–400.0)
RBC: 5.46 Mil/uL (ref 4.22–5.81)
RDW: 14.2 % (ref 11.5–15.5)
WBC: 6.2 10*3/uL (ref 4.0–10.5)

## 2023-10-07 LAB — LIPID PANEL
Cholesterol: 160 mg/dL (ref 0–200)
HDL: 54.4 mg/dL (ref >39.00)
LDL Cholesterol: 92 mg/dL (ref 0–99)
NonHDL: 105.73
Total CHOL/HDL Ratio: 3
Triglycerides: 67 mg/dL (ref 0.0–149.0)
VLDL: 13.4 mg/dL (ref 0.0–40.0)

## 2023-10-07 LAB — TSH: TSH: 1.55 u[IU]/mL (ref 0.35–5.50)

## 2023-10-07 NOTE — Progress Notes (Signed)
Subjective  Chief Complaint  Patient presents with   Annual Exam    Pt here for annual Exam and is currently fasting. Consultation for colonoscopy is schedule in 2 weeks   Hyperlipidemia    HPI: Paul Foster is a 77 y.o. male who presents to [facility] [place] today for a Male Wellness Visit. He also has the concerns and/or needs as listed above in the chief complaint. These will be addressed in addition to the Health Maintenance Visit.   Wellness Visit: annual visit with health maintenance review and exam   HM: has consultation for due colonoscopy in 2 weeks. Last colonoscopy was done in [place] 5 years ago; reports 3 polyps removed.  Doing great w/o concerns. Active, happy.  Imms up to date.   Body mass index is 26.23 kg/m. Wt Readings from Last 3 Encounters:  10/07/23 198 lb 12.8 oz (90.2 kg)  07/27/23 199 lb (90.3 kg)  11/12/22 204 lb (92.5 kg)   Chronic disease management visit and/or acute problem visit: White coat syndrome. Home bp log shows consistent normal blood pressures. HLD on statin and fasting for recheck. Has had good control.  Hx of TIA: no recurrence of sxs.  HX of prostate cancer; no recurrence. Sees urology annually.  Assessment  1. Mixed hyperlipidemia   2. Prostate cancer (HCC)   3. Polyp of colon, unspecified part of colon, unspecified type   4. Transient global amnesia   5. 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 Included topics are cancer screening recommendations, ways to keep healthy (see AVS) including dietary and exercise recommendations, regular eye and dental care, use of seat belts, and avoidance of moderate alcohol use and tobacco use.  BMI: discussed patient's BMI and encouraged positive lifestyle modifications to help get to or maintain a target BMI. HM needs and immunizations  were addressed and ordered. See below for orders. See HM and immunization section for updates. utd Routine labs and screening tests ordered including cmp, cbc and lipids where appropriate. Discussed recommendations regarding Vit D and calcium supplementation (see AVS)  Chronic disease f/u and/or acute problem visit: (deemed necessary to be done in addition to the wellness visit): HLD: recheck lipids and lfts on lovastatin 10 nighlty. Monitor lfts. Tolerates well Monitor for recurrent TIA sxs. Very stable now.  Continue home bp monitoring.  F/u with GI to get colonoscopy.   Follow up: 12 mo for cpe [facility] side effects, risks, benefits, and alternatives for medications and treatment plan prescribed today were discussed, and the patient expressed understanding of the given instructions. Patient is instructed to call or message via MyChart if he/she has any questions or concerns regarding our treatment plan. No barriers to understanding were identified. We discussed Red Flag symptoms and signs in detail. Patient expressed understanding regarding what to do in case of urgent or emergency type symptoms.  Medication list was reconciled, printed and provided to the patient in AVS. Patient instructions and summary information was reviewed with the patient as documented in the AVS. This note was prepared with assistance of Dragon voice recognition software. Occasional wrong-word or sound-a-like substitutions may have occurred due to the inherent limitations of voice recognition software  Orders Placed This Encounter  Procedures   CBC with Differential/Platelet   Comprehensive metabolic panel   Lipid panel   TSH   No orders of  the defined types were placed in this encounter.    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 10/07/2023   Transient global amnesia 07/21/2021   Mixed hyperlipidemia 11/22/2019   Family history of  premature CAD 11/22/2019   Polyp of colon 11/22/2019   Chronic allergic rhinitis 11/22/2019   Prostate cancer (HCC) 11/22/2019   Health Maintenance  Topic Date Due   Colonoscopy  04/06/2022   COVID-19 Vaccine (5 - 2023-24 season) 11/18/2023   Medicare Annual Wellness (AWV)  07/26/2024   DTaP/Tdap/Td (2 - Td or Tdap) 01/06/2029   Pneumonia Vaccine 3+ Years old  Completed   INFLUENZA VACCINE  Completed   Zoster Vaccines- Shingrix  Completed   HPV VACCINES  Aged Out   Hepatitis C Screening  Discontinued   Immunization History  Administered Date(s) Administered   Fluad Trivalent(High Dose 65+) 09/23/2023   Hepatitis B 03/09/1992   Influenza-Unspecified 10/07/2018, 08/30/2019, 09/17/2020   PFIZER(Purple Top)SARS-COV-2 Vaccination 01/20/2020, 02/14/2020, 09/18/2020   Pfizer Covid-19 Vaccine Bivalent Booster 76yrs & up 09/23/2023   Pneumococcal Conjugate-13 10/28/2016   Pneumococcal Polysaccharide-23 10/07/2018   Tdap 01/06/2019   Zoster Recombinant(Shingrix) 10/07/2018, 01/06/2019   We updated and reviewed the patient's past history in detail and it is documented below. Allergies: Patient has No Known Allergies. 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 Past Surgical History Patient  has a past surgical history that includes Tonsillectomy and adenoidectomy (1951); Appendectomy (1959); Excisional hemorrhoidectomy (1980); Cataract extraction w/ intraocular lens implant (Bilateral, 2020); Inguinal hernia repair (Bilateral, 10/17/2022); Colonoscopy (09/14/2013); and Colonoscopy (12/24/2015). Social History Patient  reports that he quit smoking about 39 years ago. His smoking use  included cigarettes. He started smoking about 59 years ago. He has never used smokeless tobacco. He reports current alcohol use of about 2.0 standard drinks of alcohol per week. He reports that he does not use drugs. Family History family history includes Heart attack in his brother, brother, father, and mother. Review of Systems: Constitutional: negative for fever or malaise Ophthalmic: negative for photophobia, double vision or loss of vision Cardiovascular: negative for chest pain, dyspnea on exertion, or new LE swelling Respiratory: negative for SOB or persistent cough Gastrointestinal: negative for abdominal pain, change in bowel habits or melena Genitourinary: negative for dysuria or gross hematuria Musculoskeletal: negative for new gait disturbance or muscular weakness Integumentary: negative for new or persistent rashes Neurological: negative for TIA or stroke symptoms Psychiatric: negative for SI or delusions Allergic/Immunologic: negative for hives  Patient Care Team    Relationship Specialty Notifications Start End  Willow Ora, MD PCP - General Family Medicine  11/22/19   Windell Norfolk, MD Consulting Physician Neurology  08/13/22   Rene Paci, MD Consulting Physician Urology  08/13/22   Jerilee Field, MD Consulting Physician Urology  08/26/22   Aris Lot, MD Consulting Physician Dermatology  08/26/22   Anselm Lis., DMD  Dentistry  08/26/22   Chiropractic, [facility]    08/26/22    Objective  Vitals: BP 122/70 Comment: by weekly home readings  Pulse 60   Temp 97.7 F (36.5 C)   Ht 6\' 1"  (1.854 m)   Wt 198 lb 12.8 oz (90.2 kg)  SpO2 99%   BMI 26.23 kg/m  General:  Well developed, well nourished, no acute distress  Psych:  Alert and orientedx3,normal mood and affect HEENT:  Normocephalic, atraumatic, non-icteric sclera,  oropharynx is clear without mass or exudate, supple neck without adenopathy, or thyromegaly Cardiovascular:   Normal S1, S2, RRR without gallop, rub or murmur,  Respiratory:  Good breath sounds bilaterally, CTAB with normal respiratory effort Gastrointestinal: normal bowel sounds, soft, non-tender, no noted masses. No HSM MSK: Joints are without erythema or swelling.  Skin:  Warm, no rashes Neurologic:    Mental status is normal.  Gross motor and sensory exams are normal. Stable gait. No tremor

## 2023-10-07 NOTE — Patient Instructions (Signed)

## 2023-10-08 NOTE — Progress Notes (Signed)
See mychart note Dear Mr. Essinger, Labs look stable. You may have been slightly dehydrated which would account for the mild elevation in your hemoglobin and decrease in GFR. Neither need further attention at this time.  Glad you are doing so well. Sincerely, Dr. Mardelle Matte

## 2023-11-17 ENCOUNTER — Other Ambulatory Visit: Payer: Self-pay

## 2023-11-17 ENCOUNTER — Telehealth: Payer: Self-pay | Admitting: Family Medicine

## 2023-11-17 MED ORDER — FLUTICASONE PROPIONATE 50 MCG/ACT NA SUSP: 1.0000 | Freq: Every day | NASAL | 3 refills | Status: DC | PRN

## 2023-11-17 NOTE — Telephone Encounter (Signed)
Pt states he has not received his refills from [facility] for fluticasone (FLONASE) 50 MCG/ACT nasal spray. Advised pt they received the order. Pt is asking for someone to check on it.

## 2023-11-17 NOTE — Telephone Encounter (Signed)
Rx sent 

## 2023-12-15 ENCOUNTER — Other Ambulatory Visit: Payer: Self-pay

## 2023-12-15 ENCOUNTER — Telehealth: Payer: Self-pay

## 2023-12-15 MED ORDER — FLUTICASONE PROPIONATE 50 MCG/ACT NA SUSP: 1.0000 | Freq: Every day | NASAL | 3 refills | Status: DC | PRN

## 2023-12-15 NOTE — Telephone Encounter (Signed)
 Copied from CRM [id number]. Topic: Clinical - Prescription Issue >> Dec 15, 2023  8:11 AM Joanell B wrote: Reason for CRM: Pt is requesting a call back regarding medication fluticasone  (FLONASE ) 50 MCG. Pt stated that the pharmacy stated that there was a technical error and is needing his pcp to reorder the medication electronically.  Flonase  was sent to pharmacy

## 2023-12-17 ENCOUNTER — Encounter: Payer: Self-pay | Admitting: Gastroenterology

## 2023-12-17 ENCOUNTER — Ambulatory Visit: Payer: Medicare Other | Admitting: Gastroenterology

## 2023-12-17 VITALS — BP 122/68 | HR 62 | Ht 73.0 in | Wt 202.0 lb

## 2023-12-17 DIAGNOSIS — Z860100 Personal history of colon polyps, unspecified: Secondary | ICD-10-CM

## 2023-12-17 DIAGNOSIS — Z09 Encounter for follow-up examination after completed treatment for conditions other than malignant neoplasm: Secondary | ICD-10-CM | POA: Diagnosis not present

## 2023-12-17 DIAGNOSIS — Z1211 Encounter for screening for malignant neoplasm of colon: Secondary | ICD-10-CM

## 2023-12-17 NOTE — Patient Instructions (Addendum)
 _______________________________________________________  If your blood pressure at your visit was 140/90 or greater, please contact your primary care physician to follow up on this.  _______________________________________________________  If you are age 80 or older, your body mass index should be between 23-30. Your Body mass index is 26.65 kg/m. If this is out of the aforementioned range listed, please consider follow up with your Primary Care Provider.  If you are age 59 or younger, your body mass index should be between 19-25. Your Body mass index is 26.65 kg/m. If this is out of the aformentioned range listed, please consider follow up with your Primary Care Provider.   ________________________________________________________  The [facility] providers would like to encourage you to use MYCHART to communicate with providers for non-urgent requests or questions.  Due to long hold times on the telephone, sending your provider a message by [facility] may be a faster and more efficient way to get a response.  Please allow 48 business hours for a response.  Please remember that this is for non-urgent requests.  _______________________________________________________  Two days before your procedure: Mix 3 packs (or capfuls) of Miralax in 48 ounces of clear liquid and drink at 6pm.  It has been recommended to you by your physician that you have a(n) Colonoscopy completed. Per your request, we did not schedule the procedure(s) today. Please contact our office at [phone] next week to see if we have a schedule  Thank you,  Dr. Lynnie Bring

## 2023-12-17 NOTE — Progress Notes (Signed)
 Chief Complaint: For colonoscopy  Referring Provider:  Jodie Lavern CROME, MD      ASSESSMENT AND PLAN;   #1. CRC screening. (Age 78, but life expectancy> 10 yrs)  #2. H/O polyps 2012  Plan: -Colon with 2 day prep   Discussed risks & benefits of colonoscopy. Risks including rare perforation req laparotomy, bleeding after bx/polypectomy req blood transfusion, rarely missing neoplasms, risks of anesthesia/sedation, rare risk of damage to internal organs. Benefits outweigh the risks. Patient agrees to proceed. All the questions were answered. Pt consents to proceed. HPI:    Paul Foster is a 78 y.o. male  Discussed the use of AI scribe software for clinical note transcription with the patient, who gave verbal consent to proceed.  History of Present Illness   The patient, with a history of prostate cancer, acid reflux, and previous rectal polyp, reports no current symptoms of diarrhea, constipation, abdominal pain, nausea, or vomiting. He maintains regular bowel movements with the aid of daily fiber intake and adequate hydration. He denies any recent episodes of hematochezia. For occasional acid reflux, he self-medicates with Tums or Pepcid Complete, which effectively manage the symptoms.  The patient underwent a colonoscopy in 2012, where a rectal polyp was discovered and a follow-up was recommended in five years. However, the patient missed the scheduled follow-up. Subsequent colonoscopies were performed in 2014 and 2017, with the latter revealing poor preparation. The patient was advised to repeat the colonoscopy in five years or sooner if any issues arose.  The patient also mentions a diagnosis of prostate cancer approximately ten years ago, which has reportedly remained stable since. He is currently retired and keeps active by maintaining a 12-acre property.   No nausea, vomiting, heartburn, regurgitation, odynophagia or dysphagia.  No significant diarrhea or constipation.  No melena  or hematochezia. No unintentional weight loss. No abdominal pain.  No FH     Results   DIAGNOSTIC Colon 2012: rectal polyp Colonoscopy: Poor preparation, hemorrhoids present, recommended repeat in two years (09/2013) Colonoscopy: Stool seen in the whole colon, exceedingly poor preparation, recommended repeat in five years (12/24/2015)       SH-retired salesman  Past Medical History:  Diagnosis Date   Benign localized prostatic hyperplasia with lower urinary tract symptoms (LUTS)    Chronic allergic rhinitis    ED (erectile dysfunction)    Hemorrhoids    Hyperlipidemia, mixed    Inguinal hernia, left    Malignant neoplasm of prostate [facility]) 2016   urologist--- dr nieves;  dx 2016, Gleason 3+3;  active survillence   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 gregg,  workup done event monitor, echo, eeg, MRI   Wears glasses     Past Surgical History:  Procedure Laterality Date   APPENDECTOMY  1959   CATARACT EXTRACTION W/ INTRAOCULAR LENS IMPLANT Bilateral 2020   COLONOSCOPY  09/14/2013   [facility] Dr Cristina Poor bowel prep. No obvious poylps or masses. Grade 3 internal hemorrhoids.   COLONOSCOPY  12/24/2015   [facility] Dr Cristina  Stool in the whole colon. Exceedingly poor prep throughout the colon   EXCISIONAL HEMORRHOIDECTOMY  1980   INGUINAL HERNIA REPAIR Bilateral 10/17/2022   Procedure: LAPAROSCOPIC BILATERAL INGUINAL HERNIA REPAIR WITH MESH;  Surgeon: Stechschulte, Deward PARAS, MD;  Location: [facility];  Service: General;  Laterality: Bilateral;   TONSILLECTOMY AND ADENOIDECTOMY  1951    Family History  Problem Relation  Age of Onset   Heart attack Mother    Heart attack Father    Heart attack Brother    Heart attack Brother    Liver disease Neg Hx    Esophageal cancer Neg Hx    Colon cancer Neg Hx     Social History   Tobacco Use   Smoking  status: Former    Current packs/day: 0.00    Types: Cigarettes    Start date: 1965    Quit date: 1985    Years since quitting: 40.0   Smokeless tobacco: Never   Tobacco comments:    Quit 1990  Vaping Use   Vaping status: Never Used  Substance Use Topics   Alcohol use: Yes    Alcohol/week: 2.0 standard drinks of alcohol    Types: 2 Cans of beer per week   Drug use: Never    Current Outpatient Medications  Medication Sig Dispense Refill   Ascorbic Acid (VITAMIN C) 1000 MG tablet Take 1,000 mg by mouth at bedtime.     aspirin  81 MG EC tablet Take 81 mg by mouth daily.     Calcium Carbonate-Vitamin D (CALTRATE 600+D PO) Take 1 tablet by mouth at bedtime.     Cholecalciferol (VITAMIN D3 MAXIMUM STRENGTH) 125 MCG (5000 UT) capsule Take 5,000 Units by mouth daily.     fluticasone  (FLONASE ) 50 MCG/ACT nasal spray Place 1 spray into both nostrils daily as needed for allergies. 48 g 3   loratadine (CLARITIN) 10 MG tablet Take 10 mg by mouth daily.     lovastatin  (MEVACOR ) 10 MG tablet Take 1 tablet (10 mg total) by mouth at bedtime. 90 tablet 3   Multiple Vitamin (MULTIVITAMIN) tablet Take 1 tablet by mouth at bedtime.     Omega-3 Fatty Acids (SUPER OMEGA 3 EPA/DHA) 1000 MG CAPS Take 1 capsule by mouth in the morning and at bedtime.     OVER THE COUNTER MEDICATION Take 1,600 mg by mouth daily. Blueberry capsule for prostate health     Wheat Dextrin (BENEFIBER PO) Take by mouth 2 (two) times daily. One tablespoon     No current facility-administered medications for this visit.    No Known Allergies  Review of Systems:  Constitutional: Denies fever, chills, diaphoresis, appetite change and fatigue.  HEENT: Denies photophobia, eye pain, redness, hearing loss, ear pain, congestion, sore throat, rhinorrhea, sneezing, mouth sores, neck pain, neck stiffness and tinnitus.   Respiratory: Denies SOB, DOE, cough, chest tightness,  and wheezing.   Cardiovascular: Denies chest pain, palpitations  and leg swelling.  Genitourinary: Denies dysuria, urgency, frequency, hematuria, flank pain and difficulty urinating.  Musculoskeletal: Denies myalgias, back pain, joint swelling, arthralgias and gait problem.  Skin: No rash.  Neurological: Denies dizziness, seizures, syncope, weakness, light-headedness, numbness and headaches.  Hematological: Denies adenopathy. Easy bruising, personal or family bleeding history  Psychiatric/Behavioral: No anxiety or depression     Physical Exam:    BP 122/68   Pulse 62   Ht 6' 1 (1.854 m)   Wt 202 lb (91.6 kg)   BMI 26.65 kg/m  Wt Readings from Last 3 Encounters:  12/17/23 202 lb (91.6 kg)  10/07/23 198 lb 12.8 oz (90.2 kg)  07/27/23 199 lb (90.3 kg)   Constitutional:  Well-developed, in no acute distress. Psychiatric: Normal mood and affect. Behavior is normal. HEENT: Pupils normal.  Conjunctivae are normal. No scleral icterus. Cardiovascular: Normal rate, regular rhythm. No edema Pulmonary/chest: Effort normal and breath sounds normal. No wheezing, rales  or rhonchi. Abdominal: Soft, nondistended. Nontender. Bowel sounds active throughout. There are no masses palpable. No hepatomegaly. Rectal: Deferred Neurological: Alert and oriented to person place and time. Skin: Skin is warm and dry. No rashes noted.  Data Reviewed: I have personally reviewed following labs and imaging studies  CBC:    Latest Ref Rng & Units 10/07/2023   10:37 AM 08/13/2022    9:53 AM 07/21/2021    4:19 PM  CBC  WBC 4.0 - 10.5 K/uL 6.2  4.8    Hemoglobin 13.0 - 17.0 g/dL 82.0  83.5  83.9   Hematocrit 39.0 - 52.0 % 54.6  48.7  47.0   Platelets 150.0 - 400.0 K/uL 253.0  229.0      CMP:    Latest Ref Rng & Units 10/07/2023   10:37 AM 08/13/2022    9:53 AM 07/21/2021    4:19 PM  CMP  Glucose 70 - 99 mg/dL 98  67  893   BUN 6 - 23 mg/dL 19  14  26    Creatinine 0.40 - 1.50 mg/dL 8.69  8.91  8.79   Sodium 135 - 145 mEq/L 137  139  139   Potassium 3.5 - 5.1 mEq/L  4.9  3.9  4.6   Chloride 96 - 112 mEq/L 104  106  106   CO2 19 - 32 mEq/L 26  23    Calcium 8.4 - 10.5 mg/dL 89.9  9.2    Total Protein 6.0 - 8.3 g/dL 7.1  6.5    Total Bilirubin 0.2 - 1.2 mg/dL 0.8  0.9    Alkaline Phos 39 - 117 U/L 55  48    AST 0 - 37 U/L 20  15    ALT 0 - 53 U/L 18  14          Anselm Bring, MD 12/17/2023, 9:09 AM  Cc: Jodie Lavern CROME, MD

## 2023-12-18 ENCOUNTER — Other Ambulatory Visit: Payer: Self-pay

## 2023-12-18 MED ORDER — FLUTICASONE PROPIONATE 50 MCG/ACT NA SUSP: 1.0000 | Freq: Every day | NASAL | 3 refills | Status: DC | PRN

## 2023-12-18 NOTE — Telephone Encounter (Signed)
 Copied from CRM [id number]. Topic: Clinical - Prescription Issue >> Dec 18, 2023  9:39 AM Graeme ORN wrote: Reason for CRM: Patient called and stated they have been having issues getting Rx filled. For fluticasone  (FLONASE ) 50 MCG/ACT nasal spray . Per Patient Rx was sent to [facility]. Any Rx over 30 days for military has to be sent to [facility] per patient. Patient requesting to have it changed to [facility] since he cancelled the order at [facility]. [facility] - [place], [place] - [address] [place] [address] Phone: [phone] Fax: [phone] Hours: Not open 24 hours  Thank You.

## 2023-12-18 NOTE — Telephone Encounter (Signed)
 Order has been resent to [facility]

## 2023-12-21 ENCOUNTER — Telehealth: Payer: Self-pay

## 2023-12-21 DIAGNOSIS — Z1211 Encounter for screening for malignant neoplasm of colon: Secondary | ICD-10-CM

## 2023-12-21 NOTE — Telephone Encounter (Signed)
 LVM for patient to call back to schedule this and see about his instructions

## 2023-12-22 MED ORDER — CLENPIQ 10-3.5-12 MG-GM -GM/175ML PO SOLN: 1.0000 | ORAL | 0 refills | Status: DC

## 2023-12-22 NOTE — Telephone Encounter (Signed)
 Instructions made and mailed and will call patient next week to schedule

## 2023-12-25 ENCOUNTER — Other Ambulatory Visit: Payer: Self-pay

## 2023-12-25 ENCOUNTER — Telehealth: Payer: Self-pay | Admitting: Family Medicine

## 2023-12-25 MED ORDER — FLUTICASONE PROPIONATE 50 MCG/ACT NA SUSP: 1.0000 | Freq: Every day | NASAL | 3 refills | Status: AC | PRN

## 2023-12-25 NOTE — Telephone Encounter (Signed)
 Copied from CRM [id number]. Topic: Clinical - Prescription Issue >> Dec 25, 2023  1:59 PM Leila BROCKS wrote: Reason for CRM: Patient [phone] states he's been having an issue with Flonase  nasal spray, the office is sending refill of 1 spray bottle per prescription. Patient needs Flonase  nasal quantity 3 bottles and one year supply to [facility]. Please update the medication because it's cheaper for patient to have 3 bottles quarterly for the year.

## 2023-12-29 NOTE — Telephone Encounter (Signed)
 Patient said he got the instructions in the mail and he read it and can follow it and will get the prep today. He was already notified by the pharmacy that it was ready

## 2024-02-07 ENCOUNTER — Encounter: Payer: Self-pay | Admitting: Certified Registered Nurse Anesthetist

## 2024-02-09 ENCOUNTER — Encounter: Payer: Self-pay | Admitting: Gastroenterology

## 2024-02-09 ENCOUNTER — Ambulatory Visit (AMBULATORY_SURGERY_CENTER): Payer: Medicare Other | Admitting: Gastroenterology

## 2024-02-09 VITALS — BP 160/89 | HR 87 | Temp 97.0°F | Resp 14 | Ht 73.0 in | Wt 202.0 lb

## 2024-02-09 DIAGNOSIS — Z1211 Encounter for screening for malignant neoplasm of colon: Secondary | ICD-10-CM | POA: Diagnosis not present

## 2024-02-09 DIAGNOSIS — Z860100 Personal history of colon polyps, unspecified: Secondary | ICD-10-CM

## 2024-02-09 DIAGNOSIS — K5730 Diverticulosis of large intestine without perforation or abscess without bleeding: Secondary | ICD-10-CM

## 2024-02-09 DIAGNOSIS — K640 First degree hemorrhoids: Secondary | ICD-10-CM | POA: Diagnosis not present

## 2024-02-09 DIAGNOSIS — D124 Benign neoplasm of descending colon: Secondary | ICD-10-CM | POA: Diagnosis not present

## 2024-02-09 MED ORDER — SODIUM CHLORIDE 0.9 % IV SOLN: 500.0000 mL | Freq: Once | INTRAVENOUS | Status: DC

## 2024-02-09 NOTE — Op Note (Signed)
 [facility] Patient Name: Paul Foster Procedure Date: 02/09/2024 1:26 PM MRN: [id number] Endoscopist: Lynann Bologna , MD, [id number] Age: 78 Referring MD:  Date of Birth: 1946-05-18 Gender: Male Account #: [id number] Procedure:                Colonoscopy Indications:              Screening for colorectal malignant neoplasm Medicines:                Monitored Anesthesia Care Procedure:                Pre-Anesthesia Assessment:                           - Prior to the procedure, a History and Physical                            was performed, and patient medications and                            allergies were reviewed. The patient's tolerance of                            previous anesthesia was also reviewed. The risks                            and benefits of the procedure and the sedation                            options and risks were discussed with the patient.                            All questions were answered, and informed consent                            was obtained. Prior Anticoagulants: The patient has                            taken no anticoagulant or antiplatelet agents. ASA                            Grade Assessment: II - A patient with mild systemic                            disease. After reviewing the risks and benefits,                            the patient was deemed in satisfactory condition to                            undergo the procedure.                           After obtaining informed consent, the colonoscope  was passed under direct vision. Throughout the                            procedure, the patient's blood pressure, pulse, and                            oxygen saturations were monitored continuously. The                            CF [id number] #[id number] was introduced through the anus                            and advanced to the 2 cm into the ileum. The                            colonoscopy was  somewhat difficult due to a                            redundant colon. Successful completion of the                            procedure was aided by applying abdominal pressure.                            The patient tolerated the procedure well. The                            quality of the bowel preparation was good except in                            some areas of the transverse colon and right colon                            where there was solid stool which could not be                            fully aspirated. The suction channel of the scope                            got clogged few times. Overall over 85 to 90% of                            the colonic mucosa was visualized satisfactorily.                            The terminal ileum, ileocecal valve, appendiceal                            orifice, and rectum were photographed. Scope In: 1:40:38 PM Scope Out: 1:58:09 PM Scope Withdrawal Time: 0 hours 13 minutes 34 seconds  Total Procedure Duration: 0 hours 17 minutes 31 seconds  Findings:  An 8 mm polyp was found in the distal descending                            colon. The polyp was semi-pedunculated. The polyp                            was removed with a cold snare. Resection and                            retrieval were complete.                           A few small-mouthed diverticula were found in the                            sigmoid colon.                           Non-bleeding internal hemorrhoids were found during                            retroflexion. The hemorrhoids were small and Grade                            I (internal hemorrhoids that do not prolapse).                           The terminal ileum appeared normal.                           The exam was otherwise without abnormality on                            direct and retroflexion views. The colon was highly                            redundant. Complications:            No  immediate complications. Estimated Blood Loss:     Estimated blood loss: none. Impression:               - One 8 mm polyp in the distal descending colon,                            removed with a cold snare. Resected and retrieved.                           - Mild sigmoid diverticulosis.                           - Non-bleeding internal hemorrhoids.                           - The examined portion of the ileum was normal.                           -  The examination was otherwise normal on direct                            and retroflexion views. Recommendation:           - Patient has a contact number available for                            emergencies. The signs and symptoms of potential                            delayed complications were discussed with the                            patient. Return to normal activities tomorrow.                            Written discharge instructions were provided to the                            patient.                           - Resume previous diet.                           - Continue present medications. If constipated,                            start MiraLAX 17 g p.o. daily.                           - Await pathology results.                           - Repeat colonoscopy for surveillance based on                            pathology results. Likely not needed d/t age                            recommendations.                           - The findings and recommendations were discussed                            with the patient's family. Lynann Bologna, MD 02/09/2024 2:02:44 PM This report has been signed electronically.

## 2024-02-09 NOTE — Progress Notes (Signed)
 Called to room to assist during endoscopic procedure.  Patient ID and intended procedure confirmed with present staff. Received instructions for my participation in the procedure from the performing physician.

## 2024-02-09 NOTE — Progress Notes (Signed)
1355 Ephedrine 10 mg given IV due to low BP, MD updated.   ?

## 2024-02-09 NOTE — Progress Notes (Signed)
 Chief Complaint: For colonoscopy  Referring Provider:  Willow Ora, MD      ASSESSMENT AND PLAN;   #1. CRC screening. (Age 78, but life expectancy> 10 yrs)  #2. H/O polyps 2012  Plan: -Colon with 2 day prep   Discussed risks & benefits of colonoscopy. Risks including rare perforation req laparotomy, bleeding after bx/polypectomy req blood transfusion, rarely missing neoplasms, risks of anesthesia/sedation, rare risk of damage to internal organs. Benefits outweigh the risks. Patient agrees to proceed. All the questions were answered. Pt consents to proceed. HPI:    Paul Foster is a 78 y.o. male  Discussed the use of AI scribe software for clinical note transcription with the patient, who gave verbal consent to proceed.  History of Present Illness   The patient, with a history of prostate cancer, acid reflux, and previous rectal polyp, reports no current symptoms of diarrhea, constipation, abdominal pain, nausea, or vomiting. He maintains regular bowel movements with the aid of daily fiber intake and adequate hydration. He denies any recent episodes of hematochezia. For occasional acid reflux, he self-medicates with Tums or Pepcid Complete, which effectively manage the symptoms.  The patient underwent a colonoscopy in 2012, where a rectal polyp was discovered and a follow-up was recommended in five years. However, the patient missed the scheduled follow-up. Subsequent colonoscopies were performed in 2014 and 2017, with the latter revealing poor preparation. The patient was advised to repeat the colonoscopy in five years or sooner if any issues arose.  The patient also mentions a diagnosis of prostate cancer approximately ten years ago, which has reportedly remained stable since. He is currently retired and keeps active by maintaining a 12-acre property.   No nausea, vomiting, heartburn, regurgitation, odynophagia or dysphagia.  No significant diarrhea or constipation.  No melena  or hematochezia. No unintentional weight loss. No abdominal pain.  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 Teresa Coombs,  workup done event monitor, echo, eeg, MRI   Wears glasses     Past Surgical History:  Procedure Laterality Date   APPENDECTOMY  1959   CATARACT EXTRACTION W/ INTRAOCULAR LENS IMPLANT Bilateral 2020   COLONOSCOPY  09/14/2013   [facility] Dr Josetta Huddle Poor bowel prep. No obvious poylps or masses. Grade 3 internal hemorrhoids.   COLONOSCOPY  12/24/2015   [facility] Dr Josetta Huddle  Stool in the whole colon. Exceedingly poor prep throughout the colon   EXCISIONAL HEMORRHOIDECTOMY  1980   INGUINAL HERNIA REPAIR Bilateral 10/17/2022   Procedure: LAPAROSCOPIC BILATERAL INGUINAL HERNIA REPAIR WITH MESH;  Surgeon: Stechschulte, Hyman Hopes, MD;  Location: [facility];  Service: General;  Laterality: Bilateral;   TONSILLECTOMY AND ADENOIDECTOMY  1951    Family History  Problem Relation  Age of Onset   Heart attack Mother    Heart attack Father    Heart attack Brother    Heart attack Brother    Liver disease Neg Hx    Esophageal cancer Neg Hx    Colon cancer Neg Hx     Social History   Tobacco Use   Smoking  status: Former    Current packs/day: 0.00    Types: Cigarettes    Start date: 1965    Quit date: 1985    Years since quitting: 40.1   Smokeless tobacco: Never   Tobacco comments:    Quit 1990  Vaping Use   Vaping status: Never Used  Substance Use Topics   Alcohol use: Yes    Alcohol/week: 2.0 standard drinks of alcohol    Types: 2 Cans of beer per week   Drug use: Never    Current Outpatient Medications  Medication Sig Dispense Refill   Ascorbic Acid (VITAMIN C) 1000 MG tablet Take 1,000 mg by mouth at bedtime.     aspirin 81 MG EC tablet Take 81 mg by mouth daily.     Calcium Carbonate-Vitamin D (CALTRATE 600+D PO) Take 1 tablet by mouth at bedtime.     Cholecalciferol (VITAMIN D3 MAXIMUM STRENGTH) 125 MCG (5000 UT) capsule Take 5,000 Units by mouth daily.     fluticasone (FLONASE) 50 MCG/ACT nasal spray Place 1 spray into both nostrils daily as needed for allergies. 48 g 3   loratadine (CLARITIN) 10 MG tablet Take 10 mg by mouth daily.     lovastatin (MEVACOR) 10 MG tablet Take 1 tablet (10 mg total) by mouth at bedtime. 90 tablet 3   Multiple Vitamin (MULTIVITAMIN) tablet Take 1 tablet by mouth at bedtime.     Omega-3 Fatty Acids (SUPER OMEGA 3 EPA/DHA) 1000 MG CAPS Take 1 capsule by mouth in the morning and at bedtime.     OVER THE COUNTER MEDICATION Take 1,600 mg by mouth daily. Blueberry capsule for prostate health     Wheat Dextrin (BENEFIBER PO) Take by mouth 2 (two) times daily. One tablespoon     Current Facility-Administered Medications  Medication Dose Route Frequency Provider Last Rate Last Admin   0.9 %  sodium chloride infusion  500 mL Intravenous Once Lynann Bologna, MD        No Known Allergies  Review of Systems:  Constitutional: Denies fever, chills, diaphoresis, appetite change and fatigue.  HEENT: Denies photophobia, eye pain, redness, hearing loss, ear pain, congestion, sore throat, rhinorrhea, sneezing, mouth sores, neck pain, neck stiffness and  tinnitus.   Respiratory: Denies SOB, DOE, cough, chest tightness,  and wheezing.   Cardiovascular: Denies chest pain, palpitations and leg swelling.  Genitourinary: Denies dysuria, urgency, frequency, hematuria, flank pain and difficulty urinating.  Musculoskeletal: Denies myalgias, back pain, joint swelling, arthralgias and gait problem.  Skin: No rash.  Neurological: Denies dizziness, seizures, syncope, weakness, light-headedness, numbness and headaches.  Hematological: Denies adenopathy. Easy bruising, personal or family bleeding history  Psychiatric/Behavioral: No anxiety or depression     Physical Exam:    BP 107/73   Pulse 81   Temp (!) 97 F (36.1 C) (Temporal)   Ht 6\' 1"  (1.854 m)   Wt 202 lb (91.6 kg)   SpO2 98%   BMI 26.65 kg/m  Wt Readings from Last 3 Encounters:  02/09/24 202 lb (91.6 kg)  12/17/23 202 lb (91.6 kg)  10/07/23 198 lb 12.8 oz (90.2 kg)   Constitutional:  Well-developed,  in no acute distress. Psychiatric: Normal mood and affect. Behavior is normal. HEENT: Pupils normal.  Conjunctivae are normal. No scleral icterus. Cardiovascular: Normal rate, regular rhythm. No edema Pulmonary/chest: Effort normal and breath sounds normal. No wheezing, rales or rhonchi. Abdominal: Soft, nondistended. Nontender. Bowel sounds active throughout. There are no masses palpable. No hepatomegaly. Rectal: Deferred Neurological: Alert and oriented to person place and time. Skin: Skin is warm and dry. No rashes noted.  Data Reviewed: I have personally reviewed following labs and imaging studies  CBC:    Latest Ref Rng & Units 10/07/2023   10:37 AM 08/13/2022    9:53 AM 07/21/2021    4:19 PM  CBC  WBC 4.0 - 10.5 K/uL 6.2  4.8    Hemoglobin 13.0 - 17.0 g/dL 09.8  11.9  14.7   Hematocrit 39.0 - 52.0 % 54.6  48.7  47.0   Platelets 150.0 - 400.0 K/uL 253.0  229.0      CMP:    Latest Ref Rng & Units 10/07/2023   10:37 AM 08/13/2022    9:53 AM 07/21/2021    4:19 PM  CMP   Glucose 70 - 99 mg/dL 98  67  829   BUN 6 - 23 mg/dL 19  14  26    Creatinine 0.40 - 1.50 mg/dL 5.62  1.30  8.65   Sodium 135 - 145 mEq/L 137  139  139   Potassium 3.5 - 5.1 mEq/L 4.9  3.9  4.6   Chloride 96 - 112 mEq/L 104  106  106   CO2 19 - 32 mEq/L 26  23    Calcium 8.4 - 10.5 mg/dL 78.4  9.2    Total Protein 6.0 - 8.3 g/dL 7.1  6.5    Total Bilirubin 0.2 - 1.2 mg/dL 0.8  0.9    Alkaline Phos 39 - 117 U/L 55  48    AST 0 - 37 U/L 20  15    ALT 0 - 53 U/L 18  14          Edman Circle, MD 02/09/2024, 1:30 PM  Cc: Willow Ora, MD

## 2024-02-09 NOTE — Progress Notes (Signed)
1345 Ephedrine 10 mg given IV due to low BP, MD updated.   

## 2024-02-09 NOTE — Patient Instructions (Signed)
 Discharge instructions given. Handouts on polyps,Diverticulosis and Hemorrhoids. Resume previous medications. Continue present medications,If constipated start Miralax 17g by mouth daily. YOU HAD AN ENDOSCOPIC PROCEDURE TODAY AT THE [facility]:   Refer to the procedure report that was given to you for any specific questions about what was found during the examination.  If the procedure report does not answer your questions, please call your gastroenterologist to clarify.  If you requested that your care partner not be given the details of your procedure findings, then the procedure report has been included in a sealed envelope for you to review at your convenience later.  YOU SHOULD EXPECT: Some feelings of bloating in the abdomen. Passage of more gas than usual.  Walking can help get rid of the air that was put into your GI tract during the procedure and reduce the bloating. If you had a lower endoscopy (such as a colonoscopy or flexible sigmoidoscopy) you may notice spotting of blood in your stool or on the toilet paper. If you underwent a bowel prep for your procedure, you may not have a normal bowel movement for a few days.  Please Note:  You might notice some irritation and congestion in your nose or some drainage.  This is from the oxygen used during your procedure.  There is no need for concern and it should clear up in a day or so.  SYMPTOMS TO REPORT IMMEDIATELY:  Following lower endoscopy (colonoscopy or flexible sigmoidoscopy):  Excessive amounts of blood in the stool  Significant tenderness or worsening of abdominal pains  Swelling of the abdomen that is new, acute  Fever of 100F or higher   For urgent or emergent issues, a gastroenterologist can be reached at any hour by calling [phone]. Do not use MyChart messaging for urgent concerns.    DIET:  We do recommend a small meal at first, but then you may proceed to your regular diet.  Drink plenty of fluids but  you should avoid alcoholic beverages for 24 hours.  ACTIVITY:  You should plan to take it easy for the rest of today and you should NOT DRIVE or use heavy machinery until tomorrow (because of the sedation medicines used during the test).    FOLLOW UP: Our staff will call the number listed on your records the next business day following your procedure.  We will call around 7:15- 8:00 am to check on you and address any questions or concerns that you may have regarding the information given to you following your procedure. If we do not reach you, we will leave a message.     If any biopsies were taken you will be contacted by phone or by letter within the next 1-3 weeks.  Please call us at [phone] if you have not heard about the biopsies in 3 weeks.    SIGNATURES/CONFIDENTIALITY: You and/or your care partner have signed paperwork which will be entered into your electronic medical record.  These signatures attest to the fact that that the information above on your After Visit Summary has been reviewed and is understood.  Full responsibility of the confidentiality of this discharge information lies with you and/or your care-partner.

## 2024-02-09 NOTE — Progress Notes (Signed)
 Report given to PACU, vss

## 2024-02-10 ENCOUNTER — Telehealth: Payer: Self-pay

## 2024-02-10 NOTE — Telephone Encounter (Signed)
  Follow up Call-     02/09/2024   12:28 PM  Call back number  Post procedure Call Back phone  # [phone]  Permission to leave phone message Yes     Patient questions:  Do you have a fever, pain , or abdominal swelling? No. Pain Score  0 *  Have you tolerated food without any problems? Yes.    Have you been able to return to your normal activities? Yes.    Do you have any questions about your discharge instructions: Diet   No. Medications  No. Follow up visit  No.  Do you have questions or concerns about your Care? No.  Actions: * If pain score is 4 or above: No action needed, pain <4.

## 2024-02-12 LAB — SURGICAL PATHOLOGY

## 2024-02-17 ENCOUNTER — Encounter: Payer: Self-pay | Admitting: Gastroenterology

## 2024-08-01 ENCOUNTER — Ambulatory Visit (INDEPENDENT_AMBULATORY_CARE_PROVIDER_SITE_OTHER): Payer: Medicare Other

## 2024-08-01 VITALS — BP 130/68 | HR 67 | Temp 98.6°F | Ht 74.0 in | Wt 200.4 lb

## 2024-08-01 DIAGNOSIS — Z0000 Encounter for general adult medical examination without abnormal findings: Secondary | ICD-10-CM | POA: Diagnosis not present

## 2024-08-01 NOTE — Progress Notes (Signed)
 Subjective:   Paul Foster is a 78 y.o. who presents for a Medicare Wellness preventive visit.  As a reminder, Annual Wellness Visits don't include a physical exam, and some assessments may be limited, especially if this visit is performed virtually. We may recommend an in-person follow-up visit with your provider if needed.  Visit Complete: In person    Persons Participating in Visit: Patient.  AWV Questionnaire: No: Patient Medicare AWV questionnaire was not completed prior to this visit.  Cardiac Risk Factors include: advanced age (>72men, >5 women);dyslipidemia;hypertension;male gender     Objective:    Today's Vitals   08/01/24 0756  BP: 130/68  Pulse: 67  Temp: 98.6 F (37 C)  SpO2: 98%  Weight: 200 lb 6.4 oz (90.9 kg)  Height: 6' 2 (1.88 m)   Body mass index is 25.73 kg/m.     08/01/2024    7:59 AM 07/27/2023    8:07 AM 10/17/2022    7:17 AM 07/21/2022    8:11 AM 07/15/2021   11:07 AM  Advanced Directives  Does Patient Have a Medical Advance Directive? Yes Yes Yes Yes Yes  Type of Estate agent of [place];Living will Healthcare Power of [place];Living will  Healthcare Power of [place];Living will Healthcare Power of Attorney  Does patient want to make changes to medical advance directive? No - Patient declined No - Patient declined No - Guardian declined    Copy of Healthcare Power of Attorney in Chart? Yes - validated most recent copy scanned in chart (See row information) Yes - validated most recent copy scanned in chart (See row information)  No - copy requested No - copy requested    Current Medications (verified) Outpatient Encounter Medications as of 08/01/2024  Medication Sig   Ascorbic Acid (VITAMIN C) 1000 MG tablet Take 1,000 mg by mouth at bedtime.   aspirin  81 MG EC tablet Take 81 mg by mouth daily.   Calcium Carbonate-Vitamin D (CALTRATE 600+D PO) Take 1 tablet by mouth at bedtime.   Cholecalciferol (VITAMIN D3 MAXIMUM  STRENGTH) 125 MCG (5000 UT) capsule Take 5,000 Units by mouth daily.   fluticasone  (FLONASE ) 50 MCG/ACT nasal spray Place 1 spray into both nostrils daily as needed for allergies.   loratadine (CLARITIN) 10 MG tablet Take 10 mg by mouth daily.   lovastatin  (MEVACOR ) 10 MG tablet Take 1 tablet (10 mg total) by mouth at bedtime.   Multiple Vitamin (MULTIVITAMIN) tablet Take 1 tablet by mouth at bedtime.   Omega-3 Fatty Acids (SUPER OMEGA 3 EPA/DHA) 1000 MG CAPS Take 1 capsule by mouth in the morning and at bedtime.   OVER THE COUNTER MEDICATION Take 1,600 mg by mouth daily. Blueberry capsule for prostate health   Wheat Dextrin (BENEFIBER PO) Take by mouth 2 (two) times daily. One tablespoon   No facility-administered encounter medications on file as of 08/01/2024.    Allergies (verified) Patient has no known allergies.   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 gregg,  workup done event monitor, echo, eeg, MRI  Wears glasses    Past Surgical History:  Procedure Laterality Date   APPENDECTOMY  1959   CATARACT EXTRACTION W/ INTRAOCULAR LENS IMPLANT Bilateral 2020   COLONOSCOPY  09/14/2013   [facility] Dr Cristina Poor bowel prep. No obvious poylps or masses. Grade 3 internal hemorrhoids.   COLONOSCOPY  12/24/2015   [facility] Dr Cristina  Stool in the whole colon. Exceedingly poor prep throughout the colon   EXCISIONAL HEMORRHOIDECTOMY  1980   INGUINAL HERNIA REPAIR Bilateral 10/17/2022   Procedure: LAPAROSCOPIC BILATERAL  INGUINAL HERNIA REPAIR WITH MESH;  Surgeon: Stechschulte, Deward PARAS, MD;  Location: [facility];  Service: General;  Laterality: Bilateral;   TONSILLECTOMY AND ADENOIDECTOMY  1951   Family History  Problem Relation Age of Onset   Heart attack Mother    Heart attack Father    Heart attack Brother    Heart attack Brother    Liver disease Neg Hx    Esophageal cancer Neg Hx    Colon cancer Neg Hx    Social History   Socioeconomic History   Marital status: Widowed    Spouse name: Paul Foster    Number of children: 0   Years of education: some college   Highest education level: Not on file  Occupational History   Occupation: Retired   Occupation: TEFL teacher  Tobacco Use   Smoking status: Former    Current packs/day: 0.00    Types: Cigarettes    Start date: 1965    Quit date: 1985    Years since quitting: 40.6   Smokeless tobacco: Never   Tobacco comments:    Quit 1990  Vaping Use   Vaping status: Never Used  Substance and Sexual Activity   Alcohol use: Yes    Alcohol/week: 2.0 standard drinks of alcohol    Types: 2 Cans of beer per week   Drug use: Never   Sexual activity: Not on file  Other Topics Concern   Not on file  Social History Narrative   Wife, Paul Foster  [place], passed July 02, 2020.   Lives alone.   Right-handed.   Caffeine use: 40 ounces per day (1/2 caff - 1/ decaf coffee)   Social Drivers of Corporate investment banker Strain: Low Risk  (08/01/2024)   Overall Financial Resource Strain (CARDIA)    Difficulty of Paying Living Expenses: Not hard at all  Food Insecurity: No Food Insecurity (08/01/2024)   Hunger Vital Sign    Worried About Running Out of Food in the Last Year: Never true    Ran Out of Food in the Last Year: Never true  Transportation Needs: No Transportation Needs (08/01/2024)   PRAPARE - Administrator, Civil Service (Medical): No    Lack of Transportation (Non-Medical): No  Physical Activity: Sufficiently Active  (08/01/2024)   Exercise Vital Sign    Days of Exercise per Week: 7 days    Minutes of Exercise per Session: 30 min  Stress: No Stress Concern Present (08/01/2024)   [employer] of Occupational Health - Occupational Stress Questionnaire    Feeling of Stress: Not at all  Social Connections: Moderately Isolated (08/01/2024)   Social Connection and Isolation Panel    Frequency of Communication with Friends and Family: More than three times a week    Frequency of Social Gatherings with Friends and Family: More than three times a week    Attends Religious Services: More than 4 times per year  Active Member of Clubs or Organizations: No    Attends Banker Meetings: Never    Marital Status: Widowed    Tobacco Counseling Counseling given: Not Answered Tobacco comments: Quit 1990    Clinical Intake:  Pre-visit preparation completed: Yes  Pain : No/denies pain     BMI - recorded: 25.73 Nutritional Status: BMI 25 -29 Overweight Nutritional Risks: None Diabetes: No  Lab Results  Component Value Date   HGBA1C 5.5 07/22/2021     How often do you need to have someone help you when you read instructions, pamphlets, or other written materials from your doctor or pharmacy?: 1 - Never  Interpreter Needed?: No  Information entered by :: Ellouise Haws, LPN   Activities of Daily Living     08/01/2024    7:57 AM  In your present state of health, do you have any difficulty performing the following activities:  Hearing? 0  Vision? 0  Difficulty concentrating or making decisions? 0  Walking or climbing stairs? 0  Dressing or bathing? 0  Doing errands, shopping? 0  Preparing Food and eating ? N  Using the Toilet? N  In the past six months, have you accidently leaked urine? N  Do you have problems with loss of bowel control? N  Managing your Medications? N  Managing your Finances? N  Housekeeping or managing your Housekeeping? N    Patient Care Team: Jodie Lavern CROME, MD as PCP - General (Family Medicine) Gregg Lek, MD as Consulting Physician (Neurology) Devere Lonni Righter, MD as Consulting Physician (Urology) Nieves Cough, MD as Consulting Physician (Urology) Lynnell Nottingham, MD as Consulting Physician (Dermatology) Gasper Alm CHRISTELLA Raddle., DMD (Dentistry) Chiropractic, [facility]  I have updated your Care Teams any recent Medical Services you may have received from other providers in the past year.     Assessment:   This is a routine wellness examination for [facility].  Hearing/Vision screen Hearing Screening - Comments:: Pt denies any hearing issues  Vision Screening - Comments:: Wears rx glasses - up to date with routine eye exams with [facility] ophthalmology    Goals Addressed             This Visit's Progress    Patient Stated       Stay health and active        Depression Screen     08/01/2024    8:00 AM 07/27/2023    8:07 AM 09/15/2022    4:29 PM 09/09/2022   10:28 AM 08/13/2022    9:30 AM 07/21/2022    8:09 AM 07/15/2021   11:06 AM  PHQ 2/9 Scores  PHQ - 2 Score 0 0 0 0 0 0 0  PHQ- 9 Score   0 0       Fall Risk     08/01/2024    8:02 AM 07/27/2023    8:09 AM 09/15/2022    4:29 PM 09/09/2022   10:28 AM 08/13/2022    9:30 AM  Fall Risk   Falls in the past year? 0 1 0 0 0  Number falls in past yr: 0 1 0 0 0  Comment  miss stepped     Injury with Fall? 0 0 0 0 0  Risk for fall due to : No Fall Risks Impaired vision No Fall Risks No Fall Risks No Fall Risks  Follow up Falls prevention discussed Falls prevention discussed Falls evaluation completed  Falls evaluation completed  Falls evaluation completed  Data saved with a previous flowsheet row definition    MEDICARE RISK AT HOME:  Medicare Risk at Home Any stairs in or around the home?: Yes If so, are there any without handrails?: No Home free of loose throw rugs in walkways, pet beds, electrical cords, etc?: Yes Adequate lighting in  your home to reduce risk of falls?: Yes Life alert?: No Use of a cane, walker or w/c?: No Grab bars in the bathroom?: Yes Shower chair or bench in shower?: Yes Elevated toilet seat or a handicapped toilet?: Yes  TIMED UP AND GO:  Was the test performed?  Yes  Length of time to ambulate 10 feet: 10 sec Gait steady and fast without use of assistive device  Cognitive Function: 6CIT completed        08/01/2024    8:03 AM 07/27/2023    8:10 AM 07/21/2022    8:13 AM 07/15/2021   11:12 AM  6CIT Screen  What Year? 0 points 0 points 0 points 0 points  What month? 0 points 0 points 0 points 0 points  What time? 0 points 0 points 0 points 0 points  Count back from 20 0 points 0 points 0 points 0 points  Months in reverse 0 points 0 points 0 points 0 points  Repeat phrase 2 points 0 points 0 points 2 points  Total Score 2 points 0 points 0 points 2 points    Immunizations Immunization History  Administered Date(s) Administered   Fluad Trivalent(High Dose 65+) 09/23/2023   Hepatitis B 03/09/1992   Influenza-Unspecified 10/07/2018, 08/30/2019, 09/17/2020   PFIZER(Purple Top)SARS-COV-2 Vaccination 01/20/2020, 02/14/2020, 09/18/2020   Pfizer Covid-19 Vaccine Bivalent Booster 20yrs & up 09/23/2023   Pneumococcal Conjugate-13 10/28/2016   Pneumococcal Polysaccharide-23 10/07/2018   Tdap 01/06/2019   Zoster Recombinant(Shingrix) 10/07/2018, 01/06/2019    Screening Tests Health Maintenance  Topic Date Due   COVID-19 Vaccine (5 - 2024-25 season) 11/18/2023   INFLUENZA VACCINE  07/15/2024   Medicare Annual Wellness (AWV)  08/01/2025   DTaP/Tdap/Td (2 - Td or Tdap) 01/06/2029   Pneumococcal Vaccine: 50+ Years  Completed   Zoster Vaccines- Shingrix  Completed   HPV VACCINES  Aged Out   Meningococcal B Vaccine  Aged Out   Hepatitis B Vaccines 19-59 Average Risk  Discontinued   Colonoscopy  Discontinued   Hepatitis C Screening  Discontinued    Health Maintenance  Health Maintenance  Due  Topic Date Due   COVID-19 Vaccine (5 - 2024-25 season) 11/18/2023   INFLUENZA VACCINE  07/15/2024   Health Maintenance Items Addressed: See Nurse Notes at the end of this note  Additional Screening:  Vision Screening: Recommended annual ophthalmology exams for early detection of glaucoma and other disorders of the eye. Would you like a referral to an eye doctor? No    Dental Screening: Recommended annual dental exams for proper oral hygiene  Community Resource Referral / Chronic Care Management: CRR required this visit?  No   CCM required this visit?  No   Plan:    I have personally reviewed and noted the following in the patient's chart:   Medical and social history Use of alcohol, tobacco or illicit drugs  Current medications and supplements including opioid prescriptions. Patient is not currently taking opioid prescriptions. Functional ability and status Nutritional status Physical activity Advanced directives List of other physicians Hospitalizations, surgeries, and ER visits in previous 12 months Vitals Screenings to include cognitive, depression, and falls Referrals and appointments  In addition, I have  reviewed and discussed with patient certain preventive protocols, quality metrics, and best practice recommendations. A written personalized care plan for preventive services as well as general preventive health recommendations were provided to patient.   Ellouise VEAR Haws, LPN   1/81/7974   After Visit Summary: (In Person-Declined) Patient declined AVS at this time.  Notes: Nothing significant to report at this time.

## 2024-08-01 NOTE — Patient Instructions (Signed)
 Mr. Wambold , Thank you for taking time out of your busy schedule to complete your Annual Wellness Visit with me. I enjoyed our conversation and look forward to speaking with you again next year. I, as well as your care team,  appreciate your ongoing commitment to your health goals. Please review the following plan we discussed and let me know if I can assist you in the future. Your Game plan/ To Do List    Referrals: If you haven't heard from the office you've been referred to, please reach out to them at the phone provided.   Follow up Visits: We will see or speak with you next year for your Next Medicare AWV with our clinical staff Have you seen your provider in the last 6 months (3 months if uncontrolled diabetes)? No  Clinician Recommendations:  Aim for 30 minutes of exercise or brisk walking, 6-8 glasses of water, and 5 servings of fruits and vegetables each day.       This is a list of the screenings recommended for you:  Health Maintenance  Topic Date Due   COVID-19 Vaccine (5 - 2024-25 season) 11/18/2023   Medicare Annual Wellness Visit  07/26/2024   Flu Shot  07/15/2024   DTaP/Tdap/Td vaccine (2 - Td or Tdap) 01/06/2029   Pneumococcal Vaccine for age over 75  Completed   Zoster (Shingles) Vaccine  Completed   HPV Vaccine  Aged Out   Meningitis B Vaccine  Aged Out   Hepatitis B Vaccine  Discontinued   Colon Cancer Screening  Discontinued   Hepatitis C Screening  Discontinued    Advanced directives: (In Chart) A copy of your advanced directives are scanned into your chart should your provider ever need it. Advance Care Planning is important because it:  [x]  Makes sure you receive the medical care that is consistent with your values, goals, and preferences  [x]  It provides guidance to your family and loved ones and reduces their decisional burden about whether or not they are making the right decisions based on your wishes.  Follow the link provided in your after visit summary  or read over the paperwork we have mailed to you to help you started getting your Advance Directives in place. If you need assistance in completing these, please reach out to us  so that we can help you!  See attachments for Preventive Care and Fall Prevention Tips.

## 2024-08-31 ENCOUNTER — Other Ambulatory Visit: Payer: Self-pay | Admitting: Family Medicine

## 2024-10-10 ENCOUNTER — Encounter: Payer: Self-pay | Admitting: Family Medicine

## 2024-10-10 ENCOUNTER — Ambulatory Visit: Payer: Medicare Other | Admitting: Family Medicine

## 2024-10-10 ENCOUNTER — Ambulatory Visit: Payer: Self-pay | Admitting: Family Medicine

## 2024-10-10 VITALS — BP 148/96 | HR 60 | Temp 97.7°F | Ht 74.0 in | Wt 200.6 lb

## 2024-10-10 DIAGNOSIS — Z8546 Personal history of malignant neoplasm of prostate: Secondary | ICD-10-CM

## 2024-10-10 DIAGNOSIS — R030 Elevated blood-pressure reading, without diagnosis of hypertension: Secondary | ICD-10-CM | POA: Diagnosis not present

## 2024-10-10 DIAGNOSIS — D582 Other hemoglobinopathies: Secondary | ICD-10-CM | POA: Insufficient documentation

## 2024-10-10 DIAGNOSIS — Z8249 Family history of ischemic heart disease and other diseases of the circulatory system: Secondary | ICD-10-CM

## 2024-10-10 DIAGNOSIS — E782 Mixed hyperlipidemia: Secondary | ICD-10-CM

## 2024-10-10 DIAGNOSIS — Z0001 Encounter for general adult medical examination with abnormal findings: Secondary | ICD-10-CM | POA: Diagnosis not present

## 2024-10-10 DIAGNOSIS — K635 Polyp of colon: Secondary | ICD-10-CM

## 2024-10-10 LAB — CBC WITH DIFFERENTIAL/PLATELET
Basophils Absolute: 0 K/uL (ref 0.0–0.1)
Basophils Relative: 0.6 % (ref 0.0–3.0)
Eosinophils Absolute: 0.1 K/uL (ref 0.0–0.7)
Eosinophils Relative: 2.1 % (ref 0.0–5.0)
HCT: 52.8 % — ABNORMAL HIGH (ref 39.0–52.0)
Hemoglobin: 17.6 g/dL — ABNORMAL HIGH (ref 13.0–17.0)
Lymphocytes Relative: 35.1 % (ref 12.0–46.0)
Lymphs Abs: 2.1 K/uL (ref 0.7–4.0)
MCHC: 33.4 g/dL (ref 30.0–36.0)
MCV: 96.3 fl (ref 78.0–100.0)
Monocytes Absolute: 0.6 K/uL (ref 0.1–1.0)
Monocytes Relative: 10.3 % (ref 3.0–12.0)
Neutro Abs: 3.1 K/uL (ref 1.4–7.7)
Neutrophils Relative %: 51.9 % (ref 43.0–77.0)
Platelets: 262 K/uL (ref 150.0–400.0)
RBC: 5.48 Mil/uL (ref 4.22–5.81)
RDW: 14.1 % (ref 11.5–15.5)
WBC: 6 K/uL (ref 4.0–10.5)

## 2024-10-10 LAB — LIPID PANEL
Cholesterol: 169 mg/dL (ref 0–200)
HDL: 48 mg/dL (ref >39.00)
LDL Cholesterol: 103 mg/dL — ABNORMAL HIGH (ref 0–99)
NonHDL: 121.46
Total CHOL/HDL Ratio: 4
Triglycerides: 91 mg/dL (ref 0.0–149.0)
VLDL: 18.2 mg/dL (ref 0.0–40.0)

## 2024-10-10 LAB — COMPREHENSIVE METABOLIC PANEL WITH GFR
ALT: 16 U/L (ref 0–53)
AST: 16 U/L (ref 0–37)
Albumin: 4.3 g/dL (ref 3.5–5.2)
Alkaline Phosphatase: 56 U/L (ref 39–117)
BUN: 17 mg/dL (ref 6–23)
CO2: 24 meq/L (ref 19–32)
Calcium: 9.6 mg/dL (ref 8.4–10.5)
Chloride: 104 meq/L (ref 96–112)
Creatinine, Ser: 1.08 mg/dL (ref 0.40–1.50)
GFR: 65 mL/min (ref >60.00)
Glucose, Bld: 85 mg/dL (ref 70–99)
Potassium: 4.8 meq/L (ref 3.5–5.1)
Sodium: 138 meq/L (ref 135–145)
Total Bilirubin: 0.7 mg/dL (ref 0.2–1.2)
Total Protein: 6.8 g/dL (ref 6.0–8.3)

## 2024-10-10 LAB — TSH: TSH: 1.61 u[IU]/mL (ref 0.35–5.50)

## 2024-10-10 NOTE — Progress Notes (Signed)
 Subjective  Chief Complaint  Patient presents with   Annual Exam    Pt here for Annual Exam and is currently fasting    Hyperlipidemia    HPI: Paul Foster is a 78 y.o. male who presents to [facility] [place] today for a Male Wellness Visit. He also has the concerns and/or needs as listed above in the chief complaint. These will be addressed in addition to the Health Maintenance Visit.   Wellness Visit: annual visit with health maintenance review and exam   HM: Healthy 78 year old male doing well.  Known concerns.  Active lifestyle.  Healthy diet.  Immunizations are current.  Body mass index is 25.76 kg/m. Wt Readings from Last 3 Encounters:  10/10/24 200 lb 9.6 oz (91 kg)  08/01/24 200 lb 6.4 oz (90.9 kg)  02/09/24 202 lb (91.6 kg)   Chronic disease management visit and/or acute problem visit: Discussed the use of AI scribe software for clinical note transcription with the patient, who gave verbal consent to proceed.  History of Present Illness Paul Foster is an 78 year old male who presents for a routine follow-up visit.  General health status - Feeling well with no current concerns - No chest pain - Cognitive function is 'A okay'  Whitecoat response without hypertension - Monitors blood pressure at home every Sunday - Readings are generally acceptable, average 120s to 130s over 70s to low 80s. - Second reading is often better - No antihypertensive medication use  Urinary function - Urinary function is good - History of prostate cancer  Physical activity - Performs stretches every morning for about thirty minutes - Recently lifted a significant amount of fertilizer as part of exercise routine  Medication and supplement use - Currently taking lovastatin  as only prescription medication for hyperlipidemia which has been well-controlled.  Well-tolerated. - Discontinued calcium supplements after pharmacy review due to sufficient dietary intake -  Stopped medication on Friday to ensure everything is clear for the visit  Dietary intake - Has not eaten anything today    Assessment  1. Encounter for well adult exam with abnormal findings   2. 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 Mixed hyperlipidemia   4. History of prostate cancer   5. Family history of premature CAD   6. Polyp of colon, unspecified part of colon, unspecified type      Plan  Male Wellness Visit: Age appropriate Health Maintenance and Prevention measures were discussed with patient. Included topics are cancer screening recommendations, ways to keep healthy (see AVS) including dietary and exercise recommendations, regular eye and dental care, use of seat belts, and avoidance of moderate alcohol use and tobacco use.  BMI: discussed patient's BMI and encouraged positive lifestyle modifications to help get to or maintain a target BMI. HM needs and immunizations were addressed and ordered. See below for orders. See HM and immunization section for updates. Routine labs and screening tests ordered including cmp, cbc and lipids where appropriate. Discussed recommendations regarding Vit D and calcium supplementation (see AVS)  Chronic disease f/u and/or acute problem visit: (deemed necessary to be done in addition to the wellness visit): Assessment and Plan Assessment & Plan Adult Wellness Visit 78 year old male presenting for an adult wellness visit. Reports feeling well with no concerns. Engages in regular physical activity, including stretching and lifting activities. No chest pain or cognitive issues reported. Urinary function is satisfactory. - Order blood work - Review lab results and contact if  changes are needed  Elevated blood-pressure reading, without diagnosis of hypertension Blood pressure readings at home are generally satisfactory, though the diastolic pressure could be improved. Currently not on any antihypertensive  medication.  Mixed hyperlipidemia Currently managed with lovastatin .  Recheck fasting levels today and LFTs    Follow up: 12 months for complete physical Commons side effects, risks, benefits, and alternatives for medications and treatment plan prescribed today were discussed, and the patient expressed understanding of the given instructions. Patient is instructed to call or message via MyChart if he/she has any questions or concerns regarding our treatment plan. No barriers to understanding were identified. We discussed Red Flag symptoms and signs in detail. Patient expressed understanding regarding what to do in case of urgent or emergency type symptoms.  Medication list was reconciled, printed and provided to the patient in AVS. Patient instructions and summary information was reviewed with the patient as documented in the AVS. This note was prepared with assistance of Dragon voice recognition software. Occasional wrong-word or sound-a-like substitutions may have occurred due to the inherent limitations of voice recognition software  Orders Placed This Encounter  Procedures   CBC with Differential/Platelet   Comprehensive metabolic panel with GFR   Lipid panel   TSH   No orders of the defined types were placed in this encounter.    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   Polyp of colon 11/22/2019   Chronic allergic rhinitis 11/22/2019   Health Maintenance  Topic Date Due   COVID-19 Vaccine (5 - 2025-26 season) 10/26/2024 (Originally 08/15/2024)   Medicare Annual Wellness (AWV)  08/01/2025   DTaP/Tdap/Td (2 - Td or Tdap) 01/06/2029   Pneumococcal Vaccine: 50+ Years  Completed   Influenza Vaccine  Completed   Zoster Vaccines- Shingrix   Completed   Meningococcal B Vaccine  Aged Out   Hepatitis B Vaccines 19-59 Average Risk  Discontinued   Colonoscopy  Discontinued   Immunization History  Administered Date(s) Administered   Fluad Trivalent(High Dose 65+) 09/23/2023, 08/08/2024   Hepatitis B 03/09/1992   Influenza-Unspecified 10/07/2018, 08/30/2019, 09/17/2020   PFIZER(Purple Top)SARS-COV-2 Vaccination 01/20/2020, 02/14/2020, 09/18/2020   Pfizer Covid-19 Vaccine Bivalent Booster 33yrs & up 09/23/2023   Pneumococcal Conjugate-13 10/28/2016   Pneumococcal Polysaccharide-23 10/07/2018   Tdap 01/06/2019   Zoster Recombinant(Shingrix) 10/07/2018, 01/06/2019   We updated and reviewed the patient's past history in detail and it is documented below. Allergies: Patient has no known allergies. 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 Past Surgical History Patient  has a past surgical history that includes Tonsillectomy and adenoidectomy (1951); Appendectomy (1959); Excisional hemorrhoidectomy (1980); Cataract extraction w/ intraocular lens implant (Bilateral, 2020); Inguinal hernia repair (Bilateral, 10/17/2022); Colonoscopy (09/14/2013); and Colonoscopy (12/24/2015). Social History Patient  reports that he quit smoking about 40 years ago. His smoking use included cigarettes. He started smoking about 60 years ago. He has never used smokeless tobacco. He reports current alcohol use of about 2.0 standard drinks of alcohol per week. He reports that he does not use drugs. Family History family  history includes Heart attack in his brother, brother, father, and mother. Review of Systems: Constitutional: negative for fever or  malaise Ophthalmic: negative for photophobia, double vision or loss of vision Cardiovascular: negative for chest pain, dyspnea on exertion, or new LE swelling Respiratory: negative for SOB or persistent cough Gastrointestinal: negative for abdominal pain, change in bowel habits or melena Genitourinary: negative for dysuria or gross hematuria Musculoskeletal: negative for new gait disturbance or muscular weakness Integumentary: negative for new or persistent rashes Neurological: negative for TIA or stroke symptoms Psychiatric: negative for SI or delusions Allergic/Immunologic: negative for hives  Patient Care Team    Relationship Specialty Notifications Start End  Jodie Lavern CROME, MD PCP - General Family Medicine  11/22/19   Gregg Lek, MD Consulting Physician Neurology  08/13/22   Devere Lonni Righter, MD Consulting Physician Urology  08/13/22   Nieves Cough, MD Consulting Physician Urology  08/26/22   Lynnell Nottingham, MD Consulting Physician Dermatology  08/26/22   Gasper Alm CHRISTELLA Mickey., DMD  Dentistry  08/26/22   Chiropractic, [facility]    08/26/22    Objective  Vitals: BP (!) 148/96   Pulse 60   Temp 97.7 F (36.5 C)   Ht 6' 2 (1.88 m)   Wt 200 lb 9.6 oz (91 kg)   SpO2 97%   BMI 25.76 kg/m  General:  Well developed, well nourished, no acute distress, looks great Psych:  Alert and orientedx3,normal mood and affect, HEENT:  Normocephalic, atraumatic, non-icteric sclera,  oropharynx is clear without mass or exudate, supple neck without adenopathy, or thyromegaly Cardiovascular:  Normal S1, S2, RRR without gallop, rub or murmur,  Respiratory:  Good breath sounds bilaterally, CTAB with normal respiratory effort Gastrointestinal: normal bowel sounds, soft, non-tender, no noted masses. No HSM MSK: Joints are without erythema or swelling.  Skin:  Warm, no rashes Neurologic:    Mental status is normal.  Gross motor and sensory exams are normal. Stable gait. No  tremor

## 2024-10-10 NOTE — Progress Notes (Signed)
 See mychart note

## 2024-10-10 NOTE — Patient Instructions (Signed)
 Please return in 12 months for your annual complete physical; please come fasting.  ? ?If you have any questions or concerns, please don't hesitate to send me a message via MyChart or call the office at [phone]. Thank you for visiting with [place] today! It's our pleasure caring for you.  ?

## 2024-10-26 ENCOUNTER — Other Ambulatory Visit: Payer: Self-pay

## 2024-10-26 ENCOUNTER — Other Ambulatory Visit: Payer: Self-pay | Admitting: Family Medicine

## 2024-10-26 ENCOUNTER — Telehealth: Payer: Self-pay

## 2024-10-26 MED ORDER — LOVASTATIN 10 MG PO TABS
10.0000 mg | ORAL_TABLET | Freq: Every day | ORAL | 3 refills | Status: AC
Start: 2024-10-26 — End: ?

## 2024-10-26 NOTE — Telephone Encounter (Signed)
 Copied from CRM #[id number]. Topic: Clinical - Prescription Issue >> Oct 26, 2024 10:06 AM Frederich PARAS wrote: Reason for CRM:pt called in he says he the pharmacy adv the had no refills for the  lovastatin  (MEVACOR ) 10 MG tablet. Pt callback # [phone]

## 2024-10-26 NOTE — Telephone Encounter (Signed)
 Copied from CRM #[id number]. Topic: Clinical - Prescription Issue >> Oct 26, 2024 10:06 AM Frederich PARAS wrote: Reason for CRM:pt called in he says he the pharmacy adv the had no refills for the  lovastatin  (MEVACOR ) 10 MG tablet. Pt callback # [phone] >> Oct 26, 2024  1:51 PM Rosina BIRCH wrote: Patient called checking [facility]. The patient stated he is out of medication and if he can get a 30 day fill sent to [facility] in [place] then he can wait on [facility] to send the medication  [phone]

## 2024-11-22 ENCOUNTER — Other Ambulatory Visit: Payer: Self-pay | Admitting: Urology

## 2024-11-22 DIAGNOSIS — C61 Malignant neoplasm of prostate: Secondary | ICD-10-CM

## 2025-01-05 ENCOUNTER — Ambulatory Visit
Admission: RE | Admit: 2025-01-05 | Discharge: 2025-01-05 | Disposition: A | Discharge status: Home / Self Care | Source: Ambulatory Visit | Attending: Urology | Admitting: Urology

## 2025-01-05 DIAGNOSIS — C61 Malignant neoplasm of prostate: Secondary | ICD-10-CM

## 2025-01-05 MED ORDER — GADOPICLENOL 0.5 MMOL/ML IV SOLN
9.0000 mL | Freq: Once | INTRAVENOUS | Status: AC | PRN
  Administered 2025-01-05: 9 mL via INTRAVENOUS

## 2025-08-03 ENCOUNTER — Ambulatory Visit
